# Patient Record
Sex: Female | Born: 1950 | Race: White | Hispanic: No | Marital: Married | Smoking: Never smoker
Health system: Southern US, Community
[De-identification: ages and names within clinical notes are randomized; demographics above are authoritative.]

## PROBLEM LIST (undated history)

## (undated) DIAGNOSIS — Z780 Asymptomatic menopausal state: Secondary | ICD-10-CM

## (undated) DIAGNOSIS — Z9889 Other specified postprocedural states: Secondary | ICD-10-CM

## (undated) DIAGNOSIS — G4700 Insomnia, unspecified: Secondary | ICD-10-CM

## (undated) DIAGNOSIS — F329 Major depressive disorder, single episode, unspecified: Secondary | ICD-10-CM

## (undated) DIAGNOSIS — F32A Depression, unspecified: Secondary | ICD-10-CM

## (undated) DIAGNOSIS — K219 Gastro-esophageal reflux disease without esophagitis: Secondary | ICD-10-CM

## (undated) DIAGNOSIS — E079 Disorder of thyroid, unspecified: Secondary | ICD-10-CM

## (undated) DIAGNOSIS — IMO0002 Reserved for concepts with insufficient information to code with codable children: Secondary | ICD-10-CM

## (undated) DIAGNOSIS — E785 Hyperlipidemia, unspecified: Secondary | ICD-10-CM

## (undated) DIAGNOSIS — D126 Benign neoplasm of colon, unspecified: Secondary | ICD-10-CM

## (undated) DIAGNOSIS — Z8679 Personal history of other diseases of the circulatory system: Secondary | ICD-10-CM

## (undated) HISTORY — DX: Benign neoplasm of colon, unspecified: D12.6

## (undated) HISTORY — DX: Other specified postprocedural states: Z98.890

## (undated) HISTORY — DX: Disorder of thyroid, unspecified: E07.9

## (undated) HISTORY — PX: COLONOSCOPY: SHX174

## (undated) HISTORY — DX: Asymptomatic menopausal state: Z78.0

## (undated) HISTORY — PX: OTHER SURGICAL HISTORY: SHX169

## (undated) HISTORY — DX: Major depressive disorder, single episode, unspecified: F32.9

## (undated) HISTORY — DX: Personal history of other diseases of the circulatory system: Z86.79

## (undated) HISTORY — DX: Insomnia, unspecified: G47.00

## (undated) HISTORY — DX: Depression, unspecified: F32.A

## (undated) HISTORY — DX: Reserved for concepts with insufficient information to code with codable children: IMO0002

## (undated) HISTORY — DX: Hyperlipidemia, unspecified: E78.5

## (undated) HISTORY — DX: Gastro-esophageal reflux disease without esophagitis: K21.9

## (undated) HISTORY — PX: ROTATOR CUFF REPAIR: SHX139

---

## 1998-05-16 ENCOUNTER — Other Ambulatory Visit: Admission: RE | Admit: 1998-05-16 | Discharge: 1998-05-16 | Payer: Self-pay | Admitting: Obstetrics and Gynecology

## 1998-08-09 ENCOUNTER — Ambulatory Visit (HOSPITAL_COMMUNITY): Admission: RE | Admit: 1998-08-09 | Discharge: 1998-08-09 | Payer: Self-pay

## 1999-04-01 ENCOUNTER — Inpatient Hospital Stay (HOSPITAL_COMMUNITY): Admission: EM | Admit: 1999-04-01 | Discharge: 1999-04-02 | Payer: Self-pay | Admitting: Emergency Medicine

## 1999-04-01 ENCOUNTER — Encounter: Payer: Self-pay | Admitting: Emergency Medicine

## 1999-04-02 ENCOUNTER — Encounter: Payer: Self-pay | Admitting: Emergency Medicine

## 1999-06-10 ENCOUNTER — Encounter: Payer: Self-pay | Admitting: Emergency Medicine

## 1999-06-10 ENCOUNTER — Emergency Department (HOSPITAL_COMMUNITY): Admission: EM | Admit: 1999-06-10 | Discharge: 1999-06-10 | Payer: Self-pay | Admitting: Emergency Medicine

## 1999-12-26 ENCOUNTER — Emergency Department (HOSPITAL_COMMUNITY): Admission: EM | Admit: 1999-12-26 | Discharge: 1999-12-26 | Payer: Self-pay | Admitting: Emergency Medicine

## 2000-01-09 ENCOUNTER — Ambulatory Visit (HOSPITAL_COMMUNITY): Admission: RE | Admit: 2000-01-09 | Discharge: 2000-01-10 | Payer: Self-pay | Admitting: Internal Medicine

## 2000-01-09 ENCOUNTER — Encounter: Payer: Self-pay | Admitting: Internal Medicine

## 2000-01-22 ENCOUNTER — Encounter: Payer: Self-pay | Admitting: Obstetrics and Gynecology

## 2000-01-22 ENCOUNTER — Encounter: Admission: RE | Admit: 2000-01-22 | Discharge: 2000-01-22 | Payer: Self-pay | Admitting: Obstetrics and Gynecology

## 2001-05-24 ENCOUNTER — Encounter: Payer: Self-pay | Admitting: Obstetrics and Gynecology

## 2001-05-24 ENCOUNTER — Ambulatory Visit (HOSPITAL_COMMUNITY): Admission: RE | Admit: 2001-05-24 | Discharge: 2001-05-24 | Payer: Self-pay | Admitting: Obstetrics and Gynecology

## 2002-01-17 ENCOUNTER — Encounter: Admission: RE | Admit: 2002-01-17 | Discharge: 2002-01-17 | Payer: Self-pay | Admitting: Obstetrics and Gynecology

## 2002-01-17 ENCOUNTER — Encounter: Payer: Self-pay | Admitting: Obstetrics and Gynecology

## 2002-06-22 ENCOUNTER — Emergency Department (HOSPITAL_COMMUNITY): Admission: EM | Admit: 2002-06-22 | Discharge: 2002-06-23 | Payer: Self-pay | Admitting: Emergency Medicine

## 2002-06-23 ENCOUNTER — Encounter: Payer: Self-pay | Admitting: Emergency Medicine

## 2002-06-24 ENCOUNTER — Ambulatory Visit (HOSPITAL_BASED_OUTPATIENT_CLINIC_OR_DEPARTMENT_OTHER): Admission: RE | Admit: 2002-06-24 | Discharge: 2002-06-24 | Payer: Self-pay | Admitting: Orthopedic Surgery

## 2004-06-14 ENCOUNTER — Other Ambulatory Visit: Admission: RE | Admit: 2004-06-14 | Discharge: 2004-06-14 | Payer: Self-pay | Admitting: Obstetrics and Gynecology

## 2004-08-14 ENCOUNTER — Encounter: Admission: RE | Admit: 2004-08-14 | Discharge: 2004-08-14 | Payer: Self-pay | Admitting: Obstetrics and Gynecology

## 2005-07-16 ENCOUNTER — Other Ambulatory Visit: Admission: RE | Admit: 2005-07-16 | Discharge: 2005-07-16 | Payer: Self-pay | Admitting: Obstetrics and Gynecology

## 2005-08-18 HISTORY — PX: OTHER SURGICAL HISTORY: SHX169

## 2006-06-05 ENCOUNTER — Encounter: Admission: RE | Admit: 2006-06-05 | Discharge: 2006-06-05 | Payer: Self-pay | Admitting: Obstetrics and Gynecology

## 2006-07-16 ENCOUNTER — Other Ambulatory Visit: Admission: RE | Admit: 2006-07-16 | Discharge: 2006-07-16 | Payer: Self-pay | Admitting: Obstetrics and Gynecology

## 2007-07-21 ENCOUNTER — Other Ambulatory Visit: Admission: RE | Admit: 2007-07-21 | Discharge: 2007-07-21 | Payer: Self-pay | Admitting: Obstetrics and Gynecology

## 2008-06-08 ENCOUNTER — Encounter: Admission: RE | Admit: 2008-06-08 | Discharge: 2008-06-08 | Payer: Self-pay | Admitting: Obstetrics and Gynecology

## 2008-07-25 ENCOUNTER — Other Ambulatory Visit: Admission: RE | Admit: 2008-07-25 | Discharge: 2008-07-25 | Payer: Self-pay | Admitting: Obstetrics and Gynecology

## 2009-07-26 ENCOUNTER — Other Ambulatory Visit: Admission: RE | Admit: 2009-07-26 | Discharge: 2009-07-26 | Payer: Self-pay | Admitting: Obstetrics and Gynecology

## 2010-06-06 ENCOUNTER — Encounter: Admission: RE | Admit: 2010-06-06 | Discharge: 2010-06-06 | Payer: Self-pay | Admitting: Obstetrics and Gynecology

## 2010-09-08 ENCOUNTER — Encounter: Payer: Self-pay | Admitting: Obstetrics and Gynecology

## 2010-09-12 ENCOUNTER — Other Ambulatory Visit: Payer: Self-pay | Admitting: Obstetrics and Gynecology

## 2010-09-12 ENCOUNTER — Other Ambulatory Visit
Admission: RE | Admit: 2010-09-12 | Discharge: 2010-09-12 | Payer: Self-pay | Source: Home / Self Care | Admitting: Obstetrics and Gynecology

## 2011-01-03 NOTE — Op Note (Signed)
   NAME:  MAKYNZEE, TIGGES                           ACCOUNT NO.:  [id number]   MEDICAL RECORD NO.:  [id number]                   PATIENT TYPE:  AMB   LOCATION:  DSC                                  FACILITY:  [facility]   PHYSICIAN:  Cindee Salt, M.D.                    DATE OF BIRTH:  11-29-1950   DATE OF PROCEDURE:  06/24/2002  DATE OF DISCHARGE:                                 OPERATIVE REPORT   PREOPERATIVE DIAGNOSES:  Foreign body, right hand.   POSTOPERATIVE DIAGNOSIS:  Foreign body, right hand.   OPERATION:  Removal of foreign body, right hand per first interosseus.   SURGEON:  Cindee Salt, M.D.   ANESTHESIA:  Form base IV regional.   HISTORY OF PRESENT ILLNESS:  The patient is a 60 year old female who has a  large piece of glass in her first dorsal interosseus muscle.  Neurovascularly, the hand is intact.  This occurred one evening while  attempting to sharpen a pencil and accidentally lacerating her hand.   PROCEDURE:  The patient was brought to the operating room where a form base  IV regional anesthetic was carried out without difficulty.  She was prepped  and draped using Duraprep, right arm.  The incision was opened and extended.  The foreign body was found extending into the muscle and slightly protruding  from the first dorsal interosseus.  This was a large wedge shape fragment.  This was removed in total without difficulty.  The area was irrigated.  The  laceration was in the direction of the fibers of the first dorsal  interosseus and as such, no repair was deemed necessary.  The skin was  closed with interrupted 5-0 nylon suture.  Sterile compression dressing was  applied.  She has a splint, which she will reapply.   FOLLOW UP:  She will return to the office in one week.   DISCHARGE MEDICATIONS:  1. Vicodin.  2. Keflex.                                               Cindee Salt, M.D.    GK/MEDQ  D:  06/24/2002  T:  06/25/2002  Job:  [id number]

## 2011-01-03 NOTE — Procedures (Signed)
. [facility]  Patient:    Rebecca Barber, Rebecca Barber                        MRN: [id number] Proc. Date: 01/09/00 Adm. Date:  [id number] Disc. Date: [id number] Attending:  Lewayne Bunting CC:         Barbette Hair. Vaughan Basta., M.D.; [place], [place].             Arturo Morton. Riley Kill, M.D. [facility]             Delrae Rend; [facility]                           Procedure Report  PROCEDURE:  Electrophysiologic study and RF catheter ablation of an accessory pathway with manifest W-P-W syndrome.  INTRODUCTION:  The patient is a 60 year old woman with a history of tachy palpitations beginning as a teenager.  Typically, these would occur only once a year or so, but over the last several months, she has had increasing frequency and severity of her symptoms.  She subsequently was seen and has been referred for electrophysiologic study and RF catheter ablation.  It should be noted that the patient has had multiple ER visits requiring adenosine to terminate her arrhythmias.  DESCRIPTION OF PROCEDURE:  After informed consent was obtained, the patient was taken to the diagnostic EP lab in a fasted state.  After the usual preparation and draping, intravenous fentanyl and midazolam were given for conscious sedation.  A #6 French hexapolar catheter was inserted percutaneously in the right jugular vein and advanced to the coronary sinus. A #5 French quadripolar catheter was inserted percutaneously in the right femoral vein and advanced to the RV apex.  A #5 French quadripolar catheter was inserted percutaneously in the right femoral vein and advanced to the His bundle region.  After measurement of the basic intervals, rapid ventricular pacing was carried out from the RV apex, and this demonstrated eccentric, atrial activation with the distal CS catheter being the early site of atrial activation.  Rapid ventricular pacing was carried out from the RV apex, and the pacing cycle length  decreased down to 240 ms, where the ventricular refractoriness was observed.  Prior to this, V-A conduction was maintained 1:1 and was non-detrimental.  Next, programmed ventricular stimulation was carried out from the RV apex at a at a basic drive cycle length of 578 ms.  The S1/S2 interval stepwise decreased from 400 ms down to 350 ms.  At this cycle length, the patient had nonsustained A-V reentrant tachycardia.  This was characterized by eccentric atrial activation with a V-A interval of approximately 82 ms.  The tachycardia terminated with block in the A-V node. Additional decrements in the cycle length down to 250 ms resulted in ventricular refractoriness.  Next, programmed atrial stimulation was carried out from the coronary sinus at a basic drive cycle length of 469 ms.  The S1/S2 interval was stepwise decreased down to 270 ms, where the atrial ERP was observed.  Once again during programmed atrial stimulation, the ventricle was maximally pre-excited as evidenced by pacing very close to the accessory pathway along the left lateral portion of the mitral annulus.  Accessory pathway conduction was maintained at an S1/S2 interval down to 260 ms, where atrial ERP was observed.  Next, rapid atrial pacing was carried out from the high right atrium at a pacing cycle length of 490 ms, and again,  stepwise decreased down to 250 ms, where atrial refractoriness was observed.  A-V conduction was maintained 1:1, and the ventricular activation was maximally pre-excited.  At this point, the fossa ovalis was probed and found not to be patent with the ablation catheter.  The right femoral artery was punctured, an afferent sheath was placed, the ablation catheter was maneuvered up the right femoral artery, and it was advanced retrograde across the aortic valve.  It was positioned along the mitral annulus.  Mapping of the patients accessory pathway with conduction was performed along the mitral annulus  with both ventricular and atrial pacing.  At a location approximately 3.5-4 cm from the os of the coronary sinus, there was a fractionated electrogram found.  At this location during rapid atrial pacing, the RF energy application was delivered. It should be noted that approximately 6-7 s after RF energy application, the patients manifest pre-excitation resolved.  A total of 120 s of RF energy application was delivered to this location.  Following this, ventricular pacing was carried out, and it demonstrated V-A association.  At this point, the patient was observed for approximately 40 minutes, and additional rapid atrial pacing and rapid ventricular pacing were carried out, and this demonstrated no evidence of residual accessory pathway conduction.  In addition, programmed atrial and ventricular stimulation were carried out, and this demonstrated no evidence of dual A-V nodal physiology, and there were no other inducible supraventricular arrhythmias.  At this point, the catheters were removed, hemostasis was assured, and the patient was returned to her room in good condition.  COMPLICATIONS:  None.  RESULTS: 1. Baseline ECG:  The baseline 12-lead ECG demonstrated normal sinus rhythm    with normal access in the intervals.  The P-R interval was somewhat    shortened at 150 ms, but there was no evidence of ventricular    pre-excitation. 2. Baseline intervals:  The sinus node cycle length was 952 ms.  The P-R    interval was 116 ms.  The A-H interval was 60 ms.  The H-V interval was    43 ms. The Q-R restoration was 83 ms.  The Q-T interval was 409 ms. 3. Rapid atrial pacing:  Rapid atrial pacing was carried out from the coronary    sinus down to 250 ms, which demonstrated the atrial ERP.  The A-V    conduction was 1:1 with maximal pre-excitation.  Following RF energy    application, the A-V Wenchebach cycle length was 290 ms.  The P-R interval    remained less than the R interval. 4.  Programmed atrial stimulation:  Programmed atrial stimulation was carried    out from the coronary sinus at a basic drive cycle length of 161 ms.  The     S1/S2 interval was stepwise decreased down to 350 ms, where there was    nonsustained A-V reentrant tachycardia.  Additional decrements in the S1/S2    interval down to 260 ms resulted in the ERP of the atrium.  A-V conduction    was maintained at 1:1 with maximal pre-excitation. 5. Rapid ventricular pacing:  Rapid ventricular pacing was carried out from    the RV apex and stepwise decreased down to 260 ms, where the ventricular    ERP was observed.  V-A conduction was eccentric, non-decremental, and    maintained at this pacing cycle length. 6. Programmed ventricular stimulation:  Programmed ventricular stimulation was    carried out from the RV apex at a basic drive cycle length  of 500 ms.  The    S1/S2 interval was stepwise decreased down to 250 ms where the ventricular    ERP was observed.  The V-A conduction remained non-decremental and    eccentric durin programmed ventricular stimulation. 7. Arrhythmias observed:  A-V reentrant tachycardia.  The QRS morphology was    narrow.  The cycle length was 300 ms.  The method of induction was    programmed ventricular stimulation, and the method of termination was    spontaneous as the rhythm was nonsustained.  It should be noted that    termination of the arrhythmia was present with A-V block. 8. Mapping:  Mapping of the patients accessory pathway demonstrated it to be    approximately 3.5-4 cm from the os of the coronary sinus. 9. RF energy application:  A single, 120 s, RF energy application was    delivered to the ventricular insertion of the mitral annulus, approximately    4 cm from the os of the coronary sinus.  This resulted in termination of    the patients pre-excitation and the creation of V-A association during    ventricular pacing.  Following this, the patient was observed for  40    minutes and had no evidence of residual accessory pathway conduction.  CONCLUSION:  This demonstrates evidence of what was initially concealed, but then with atrial pacing, manifest accessory pathway conduction with inducible nonsustained A-V reentrant tachycardia.  The accessory pathway was successfully ablated with a single RF energy application delivered approximately 4 cm from the os of the coronary sinus along the ventricular insertion of the accessory pathway.  There were no immediate procedural complications. DD:  01/09/00 TD:  01/13/00 Job: [id number]

## 2011-01-03 NOTE — Discharge Summary (Signed)
[place]  Patient:    Rebecca Barber, Rebecca Barber                        MRN: [id number] Adm. Date:  [id number] Disc. Date: [id number] Attending:  Lewayne Bunting Dictator:   Abelino Derrick, P.A.C. [facility]                           Discharge Summary  DISCHARGE DIAGNOSIS:  Recurrent supraventricular tachycardia status post                       radiofrequency ablation this admission.  HOSPITAL COURSE:  Patient is a 60 year old female with a history of tachy palpitations and SVT who was admitted for EP study and radiofrequency ablation on 01/09/00 by Dr. Ladona Ridgel.  She has a history of documented SVT which has been increasing in severity.  She was electively admitted and underwent EP study 01/09/00 by Dr. Ladona Ridgel, please see op note for complete details.  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  She underwent successful radiofrequency ablation and is discharged on 01/10/00.  DISCHARGE MEDICATIONS: 1. Coated aspirin q.d. for four weeks. 2. Loestrin q.d. 3. Synthroid 0.1 mg a day. 4. Trazodone 15 mg h.s.  LABORATORY DATA:  White count 5.1, hemoglobin 14.6, hematocrit 41.6, platelet count 272.  INR 0.9.  Sodium 130, potassium 4.2, BUN 17, creatinine 1.2. Serum pregnancy test was negative.  EKG on 01/10/00 shows normal sinus rhythm with no acute changes.  DISPOSITION:  Patient is discharged in stable condition and will follow up with Dr. Ladona Ridgel in six to eight weeks.  She has been instructed not to do any heavy lifting or driving until Monday. DD:  01/10/00 TD:  01/10/00 Job: [id number]

## 2011-01-16 ENCOUNTER — Encounter: Payer: Self-pay | Admitting: *Deleted

## 2011-01-23 ENCOUNTER — Ambulatory Visit (INDEPENDENT_AMBULATORY_CARE_PROVIDER_SITE_OTHER): Payer: 59 | Admitting: Family Medicine

## 2011-01-23 ENCOUNTER — Encounter: Payer: Self-pay | Admitting: Family Medicine

## 2011-01-23 VITALS — BP 110/68 | HR 76 | Ht 66.0 in | Wt 146.0 lb

## 2011-01-23 DIAGNOSIS — Z0000 Encounter for general adult medical examination without abnormal findings: Secondary | ICD-10-CM

## 2011-01-23 DIAGNOSIS — G4700 Insomnia, unspecified: Secondary | ICD-10-CM

## 2011-01-23 DIAGNOSIS — E039 Hypothyroidism, unspecified: Secondary | ICD-10-CM

## 2011-01-23 DIAGNOSIS — R5383 Other fatigue: Secondary | ICD-10-CM

## 2011-01-23 DIAGNOSIS — E7800 Pure hypercholesterolemia, unspecified: Secondary | ICD-10-CM

## 2011-01-23 LAB — CBC WITH DIFFERENTIAL/PLATELET
Basophils Absolute: 0 10*3/uL (ref 0.0–0.1)
Eosinophils Relative: 1 % (ref 0–5)
HCT: 40.8 % (ref 36.0–46.0)
Hemoglobin: 13.7 g/dL (ref 12.0–15.0)
Lymphocytes Relative: 30 % (ref 12–46)
Lymphs Abs: 1.5 10*3/uL (ref 0.7–4.0)
MCV: 92.3 fL (ref 78.0–100.0)
Monocytes Absolute: 0.4 10*3/uL (ref 0.1–1.0)
Monocytes Relative: 7 % (ref 3–12)
RDW: 13.1 % (ref 11.5–15.5)
WBC: 5 10*3/uL (ref 4.0–10.5)

## 2011-01-23 LAB — LIPID PANEL
Cholesterol: 239 mg/dL — ABNORMAL HIGH (ref 0–200)
Total CHOL/HDL Ratio: 3.5 Ratio
Triglycerides: 60 mg/dL (ref <150)
VLDL: 12 mg/dL (ref 0–40)

## 2011-01-23 LAB — T4, FREE: Free T4: 1.13 ng/dL (ref 0.80–1.80)

## 2011-01-23 LAB — COMPREHENSIVE METABOLIC PANEL
AST: 25 U/L (ref 0–37)
Alkaline Phosphatase: 71 U/L (ref 39–117)
BUN: 17 mg/dL (ref 6–23)
Creat: 1.09 mg/dL (ref 0.50–1.10)

## 2011-01-23 MED ORDER — SYNTHROID 88 MCG PO TABS: 88.0000 ug | ORAL_TABLET | Freq: Every day | ORAL | Status: DC

## 2011-01-23 MED ORDER — TRAZODONE HCL 150 MG PO TABS: 150.0000 mg | ORAL_TABLET | Freq: Every day | ORAL | Status: DC

## 2011-01-23 NOTE — Progress Notes (Signed)
Subjective:    Patient ID: Rebecca Barber, female    DOB: [birth year], 60 y.o.   MRN: [id number]  HPI  Patient presents to transfer care from [facility].  She is requesting medication refills for her thyroid and sleeping medications. She has no specific concerns.  Hyperlipidemia follow-up:  Patient is reportedly following a low-fat, low cholesterol diet. Last check was in 03/2009, LDL was 147, HDL 65, TG 68, total 227.  Has never had hs-CRP checked.  Hypothyroid follow-up.  Last TSH was 05/2010 and was normal.  She denies having any thyroid-related symptoms.  She is requesting to have free T3 (and T4) checked along with her TSH.  She is fasting today; hasn't had fasting sugar checked in many years  Past Medical History  Diagnosis Date  . Hyperlipidemia   . Thyroid disease hypothyroidism  . Postmenopausal   . Depression h/o in the 90's due to hypothyroid;also h/o pain as a result of thyroid not beingcontrolled for many years, per pt  . Insomnia   . Cyst in "bone marrow" in wrist  . S/P catheter ablation of slow pathway EP stud w/RF catheter ablation of accessory pathway with WPW-DrTaylor 5/01    Past Surgical History  Procedure Date  . Ganglion cyst left foot ,age 63  . Rotator cuff repair RIGHT,Dr.Norris  . Injury of left meniscus DrOlin 02/2008    History   Social History  . Marital Status: Married    Spouse Name: N/A    Number of Children: 4  . Years of Education: N/A   Occupational History  . 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 Smoking status: Never Smoker   . Smokeless tobacco: Never Used  . Alcohol Use: Yes     2-3 glasses of wine weekly  . Drug Use: No  . Sexually Active: Not on file   Other Topics Concern  . Not on file   Social History Narrative  . No narrative on file    Family History  Problem Relation Age of Onset  . Stroke Mother   . Transient ischemic attack Mother   . Stroke Father   . Aneurysm Father     brain  . Heart attack  Brother   . Schizophrenia Brother   . Eating disorder Daughter   . Cancer Brother     parathyroid and agent orange  . Depression Brother   . Cancer Cousin     breast CA in her 59's  . Fibromyalgia Daughter     Current outpatient prescriptions:SYNTHROID 88 MCG tablet, Take 1 tablet (88 mcg total) by mouth daily., Disp: 90 tablet, Rfl: 3;  traZODone (DESYREL) 150 MG tablet, Take 1 tablet (150 mg total) by mouth at bedtime., Disp: 90 tablet, Rfl: 3;  DISCONTD: levothyroxine (SYNTHROID) 88 MCG tablet, Take 88 mcg by mouth daily.  , Disp: , Rfl: ;  DISCONTD: traZODone (DESYREL) 150 MG tablet, Take 150 mg by mouth at bedtime.  , Disp: , Rfl:  DISCONTD: levothyroxine (SYNTHROID, LEVOTHROID) 88 MCG tablet, Take 88 mcg by mouth daily.  , Disp: , Rfl:   Allergies  Allergen Reactions  . Celebrex (Celecoxib) Itching, Swelling and Other (See Comments)    Facial blisters   Review of Systems Deals with some chronic fatigue, and trouble with metabolism (if doesn't watch her diet closely, can easily gain weight quickly).  Denies bowel changes, skin changes or hair loss.  Moods are good. Insomnia controlled with 1/2-1 trazadone  Objective:   Physical Exam  Well developed, well nourished patient, in no distress BP 110/68  Pulse 76  Ht 5\' 6"  (1.676 m)  Wt 146 lb (66.225 kg)  BMI 23.56 kg/m2 Neck: No lymphadenopathy or thyromegaly, no carotid bruit Heart:  Regular rate and rhythm, no murmurs, rubs, gallops or ectopy Lungs:  Clear bilaterally, without wheezes, rales or ronchi Abdomen:  Soft, nontender, nondistended, no hepatosplenomegaly or masses, normal bowel sounds Extremities:  No clubbing, cyanosis or edema, 2+ pulses.  Neuro:  Alert and oriented x 3, cranial nerves grossly intact.  DTR's 2+ and symmetric.  Normal strength and sensation Back:  No spine or CVA tenderness Skin: no rashes or suspicious lesions. Sebaceous cyst above L lateral clavicle Psych:  Normal mood, affect, hygiene and  grooming, normal speech, eye contact      Assessment & Plan:   1. Unspecified hypothyroidism  TSH, T3, free, T4, free, SYNTHROID 88 MCG tablet  2. Pure hypercholesterolemia  Lipid panel, CRP High sensitivity  3. Routine general medical examination at a health care facility    4. Fatigue  Comprehensive metabolic panel, Vitamin D 25 hydroxy, CBC with Differential  5. Insomnia  traZODone (DESYREL) 150 MG tablet    Tdap likely due in 2013 (had ER visit approx 5 years ago, but doesn't recall if Td given, recalls getting Td in 2003) Declines flu shots Zostavax discussed, including risks/benefits.  She will check insurance coverage, and can call to schedule NV if desired. Colonoscopy 2003, due again 2013

## 2011-01-27 ENCOUNTER — Encounter: Payer: Self-pay | Admitting: Family Medicine

## 2011-11-11 ENCOUNTER — Telehealth: Payer: Self-pay | Admitting: Family Medicine

## 2011-11-11 NOTE — Telephone Encounter (Signed)
DONE

## 2012-02-27 ENCOUNTER — Encounter: Payer: Self-pay | Admitting: Internal Medicine

## 2012-12-02 ENCOUNTER — Encounter: Payer: Self-pay | Admitting: Internal Medicine

## 2013-06-16 ENCOUNTER — Other Ambulatory Visit: Payer: Self-pay

## 2013-06-16 DIAGNOSIS — Z1231 Encounter for screening mammogram for malignant neoplasm of breast: Secondary | ICD-10-CM

## 2013-10-26 ENCOUNTER — Encounter: Payer: Self-pay | Admitting: Family Medicine

## 2014-05-12 ENCOUNTER — Encounter: Payer: Self-pay | Admitting: Internal Medicine

## 2014-06-05 ENCOUNTER — Ambulatory Visit
Admission: RE | Admit: 2014-06-05 | Discharge: 2014-06-05 | Disposition: A | Discharge status: Home / Self Care | Payer: 59 | Source: Ambulatory Visit

## 2014-06-05 DIAGNOSIS — Z1231 Encounter for screening mammogram for malignant neoplasm of breast: Secondary | ICD-10-CM

## 2014-06-19 ENCOUNTER — Encounter: Payer: Self-pay | Admitting: Family Medicine

## 2014-07-03 ENCOUNTER — Ambulatory Visit (AMBULATORY_SURGERY_CENTER): Payer: Self-pay | Admitting: *Deleted

## 2014-07-03 VITALS — Ht 66.0 in | Wt 159.4 lb

## 2014-07-03 DIAGNOSIS — Z1211 Encounter for screening for malignant neoplasm of colon: Secondary | ICD-10-CM

## 2014-07-03 MED ORDER — MOVIPREP 100 G PO SOLR: 1.0000 | Freq: Once | ORAL | Status: DC

## 2014-07-03 NOTE — Progress Notes (Signed)
No egg or soy allergy. ewm No diet pills. ewm No home 02 use. ewm No problems with past sedation. ewm

## 2014-07-07 ENCOUNTER — Encounter: Payer: Self-pay | Admitting: Internal Medicine

## 2014-07-18 DIAGNOSIS — D126 Benign neoplasm of colon, unspecified: Secondary | ICD-10-CM

## 2014-07-18 HISTORY — DX: Benign neoplasm of colon, unspecified: D12.6

## 2014-07-19 ENCOUNTER — Encounter: Payer: Self-pay | Admitting: Internal Medicine

## 2014-07-19 ENCOUNTER — Ambulatory Visit (AMBULATORY_SURGERY_CENTER): Payer: 59 | Admitting: Internal Medicine

## 2014-07-19 VITALS — BP 139/70 | HR 76 | Temp 97.0°F | Resp 16 | Ht 66.0 in | Wt 159.0 lb

## 2014-07-19 DIAGNOSIS — D122 Benign neoplasm of ascending colon: Secondary | ICD-10-CM

## 2014-07-19 DIAGNOSIS — Z1211 Encounter for screening for malignant neoplasm of colon: Secondary | ICD-10-CM

## 2014-07-19 MED ORDER — SODIUM CHLORIDE 0.9 % IV SOLN: 500.0000 mL | INTRAVENOUS | Status: DC

## 2014-07-19 NOTE — Progress Notes (Signed)
1115PATIENT STATING SHE CANNOT BREATHE WELL, CROUPY COUGH. ENCOURAGED SLOW EASY BREATHS, INCREASED 02 TO 6L/MIN. PATIENT CALMING DOWN, SA02 REMAINS NORMAL. SKIN WARM DRY AND PINK.DECREASED 02 TO 4L/MN AT 1120. JOHN NULTY CRNA IN TO CHECK PATIENT. RESPIRATIONS NONLABORED, SKIN WARM DRY AND PINK, VITAL SIGNS STABLE. 02 DECREASED TO 2L/MIN. 1125 HUSBAND AT BEDSIDE, PATIENT STARTED WITH CROWING RESPIRATIONS, VERY ANXIOUS AND COMPLAINED  OF INABILITY TO BREATH. DR. Olevia Perches AT BEDSIDE. L[place]NN [place]. ALBUTEROL NEBULIZER ORDERED. NEBULIZER TREATMENT GIVEN AT 1130. RESPIRATIONS CALMED. PATIENT PASSING FLATUS IN LARGE AMOUNTS. PATIENT CALM, NO FURTHER COUGHING AT 1155.

## 2014-07-19 NOTE — Patient Instructions (Signed)
PLEASE CALL DR. Olevia Perches WITH TEMPERATURE GREATER THAN 100. CALL WITH ANY RESPIRATORY SYMPTOMS, INCREASED COUGH OR SPUTUM PRODUCTION.   YOU HAD AN ENDOSCOPIC PROCEDURE TODAY AT [facility]: Refer to the procedure report that was given to you for any specific questions about what was found during the examination.  If the procedure report does not answer your questions, please call your gastroenterologist to clarify.  If you requested that your care partner not be given the details of your procedure findings, then the procedure report has been included in a sealed envelope for you to review at your convenience later.  YOU SHOULD EXPECT: Some feelings of bloating in the abdomen. Passage of more gas than usual.  Walking can help get rid of the air that was put into your GI tract during the procedure and reduce the bloating. If you had a lower endoscopy (such as a colonoscopy or flexible sigmoidoscopy) you may notice spotting of blood in your stool or on the toilet paper. If you underwent a bowel prep for your procedure, then you may not have a normal bowel movement for a few days.  DIET: Your first meal following the procedure should be a light meal and then it is ok to progress to your normal diet.  A half-sandwich or bowl of soup is an example of a good first meal.  Heavy or fried foods are harder to digest and may make you feel nauseous or bloated.  Likewise meals heavy in dairy and vegetables can cause extra gas to form and this can also increase the bloating.  Drink plenty of fluids but you should avoid alcoholic beverages for 24 hours.  ACTIVITY: Your care partner should take you home directly after the procedure.  You should plan to take it easy, moving slowly for the rest of the day.  You can resume normal activity the day after the procedure however you should NOT DRIVE or use heavy machinery for 24 hours (because of the sedation medicines used during the test).    SYMPTOMS TO REPORT  IMMEDIATELY: A gastroenterologist can be reached at any hour.  During normal business hours, 8:30 AM to 5:00 PM Monday through Friday, call [phone].  After hours and on weekends, please call the GI answering service at [phone] who will take a message and have the physician on call contact you.   Following lower endoscopy (colonoscopy or flexible sigmoidoscopy):  Excessive amounts of blood in the stool  Significant tenderness or worsening of abdominal pains  Swelling of the abdomen that is new, acute  Fever of 100F or higher  FOLLOW UP: If any biopsies were taken you will be contacted by phone or by letter within the next 1-3 weeks.  Call your gastroenterologist if you have not heard about the biopsies in 3 weeks.  Our staff will call the home number listed on your records the next business day following your procedure to check on you and address any questions or concerns that you may have at that time regarding the information given to you following your procedure. This is a courtesy call and so if there is no answer at the home number and we have not heard from you through the emergency physician on call, we will assume that you have returned to your regular daily activities without incident.  SIGNATURES/CONFIDENTIALITY: You and/or your care partner have signed paperwork which will be entered into your electronic medical record.  These signatures attest to the fact that that the  information above on your After Visit Summary has been reviewed and is understood.  Full responsibility of the confidentiality of this discharge information lies with you and/or your care-partner.

## 2014-07-19 NOTE — Progress Notes (Signed)
1041- abdominal pressure applied by tech- immediate clear fluid in oral mouth- suctioned quickly increased 02, allowed to wake - HOB elevated. Suctioned frequently.

## 2014-07-19 NOTE — Progress Notes (Signed)
ON ARRIVAL TO RECOVERY PATIENT COUGHING, NONPRODUCTIVELY. NO RESPIRATORY DISTRESS, SKIN WARM DRY AND PINK. ABDOMEN SOFT, NO FLATUS PASSSED. 02 AT 2L/MIN. DR. Olevia Perches AT BEDSIDE. CHEST ON AUSCULTATION, SOME RHONCHI BILATERALLY, CLEARS PAST COUGHING. TEMPERATURE 96.4.

## 2014-07-19 NOTE — Op Note (Signed)
[place]  [address] [place], [id number]   COLONOSCOPY PROCEDURE REPORT  PATIENT: Rebecca, Barber  MR#: [id number] BIRTHDATE: 05/30/1951 , 19  yrs. old GENDER: female ENDOSCOPIST: Lafayette Dragon, MD REFERRED Terrall Laity, M.D. , Dr Carrolyn Meiers PROCEDURE DATE:  07/19/2014 PROCEDURE:   Colonoscopy with snare polypectomy First Screening Colonoscopy - Avg.  risk and is 50 yrs.  old or older - No.  Prior Negative Screening - Now for repeat screening. 10 or more years since last screening  History of Adenoma - Now for follow-up colonoscopy & has been > or = to 3 yrs.  N/A  Polyps Removed Today? Yes. ASA CLASS:   Class II INDICATIONS:average risk for colon cancer and prior colonoscopy in September 2003 was a normal exam. MEDICATIONS: Monitored anesthesia care and Propofol 240 mg IV  DESCRIPTION OF PROCEDURE:   After the risks benefits and alternatives of the procedure were thoroughly explained, informed consent was obtained.  The digital rectal exam revealed no abnormalities of the rectum.   The LB 1528  endoscope was introduced through the anus and advanced to the cecum, which was identified by both the appendix and ileocecal valve. No adverse events experienced.   The quality of the prep was good, using MoviPrep  The instrument was then slowly withdrawn as the colon was fully examined.      COLON FINDINGS: A sessile polyp measuring 8 mm in size was found in the ascending colon.  A polypectomy was performed with a cold snare.  The resection was complete, the polyp tissue was completely retrieved and sent to histology.  Retroflexed views revealed no abnormalities. The time to cecum=6 minutes 00 seconds.  Withdrawal time=11 minutes 00 seconds.  The scope was withdrawn and the procedure completed. COMPLICATIONS: There were no immediate complications.  ENDOSCOPIC IMPRESSION: Sessile polyp was found in the ascending colon; polypectomy was performed with a cold  snare  RECOMMENDATIONS: 1.  Await pathology results 2.  High fiber diet Recall colonoscopy pending path report  eSigned:  Lafayette Dragon, MD 07/19/2014 10:54 AM   cc:

## 2014-07-19 NOTE — Progress Notes (Signed)
PATIENT GREATLY IMPROVED. PATIENT PASSING GAS AND NO LONGER COUGHING. SKIN WARM DRY AND PINK.DR. Olevia Perches IN TO DISCHARGE PATIENT. PATIENT CLEARED FOR DISCHARGE. PATIENT AND CARE PARTNER VERBALIZED AGREEMENT WITH DISCHARGE.

## 2014-07-19 NOTE — Progress Notes (Signed)
Called to room to assist during endoscopic procedure.  Patient ID and intended procedure confirmed with present staff. Received instructions for my participation in the procedure from the performing physician.  

## 2014-07-19 NOTE — Progress Notes (Signed)
In to start IV.  Pt very anxious and confrontational.  Explained to pt I was getting ready to stick.  Pt stiffened up, jumped and pulled arm back.  Started IV fluids and vein at insertion site started to swell.  Stopped IV fluids and removed catheter.  Pt very ugly. Explained to her when she jumped the needle went through the vein.  Pt still continues to be verbally ugly.  Placed pressure dressing to site and called Edmon Crape Rn in to start IV.

## 2014-07-20 ENCOUNTER — Telehealth: Payer: Self-pay | Admitting: *Deleted

## 2014-07-20 NOTE — Telephone Encounter (Signed)
  Follow up Call-  Call back number 07/19/2014  Post procedure Call Back phone  # [phone]  Permission to leave phone message Yes     Patient questions:  Do you have a fever, pain , or abdominal swelling? No. Pain Score  0 *  Have you tolerated food without any problems? Yes.    Have you been able to return to your normal activities? Yes.    Do you have any questions about your discharge instructions: Diet   No. Medications  No. Follow up visit  No.  Do you have questions or concerns about your Care? Yes.    Actions: * If pain score is 4 or above: No action needed, pain <4.  Pt. States that she has a "raw throat" as a result of suctioning yesterday.  Advised to drink warm liquids today. She had headace as well.  Took Advil last night with relief.  Advised to call if difficulty breathing or fever arise.

## 2014-07-24 ENCOUNTER — Encounter: Payer: Self-pay | Admitting: Internal Medicine

## 2014-07-27 ENCOUNTER — Encounter: Payer: Self-pay | Admitting: Family Medicine

## 2014-10-18 DIAGNOSIS — M25511 Pain in right shoulder: Secondary | ICD-10-CM | POA: Diagnosis not present

## 2014-10-20 DIAGNOSIS — Z01419 Encounter for gynecological examination (general) (routine) without abnormal findings: Secondary | ICD-10-CM | POA: Diagnosis not present

## 2014-12-07 DIAGNOSIS — L570 Actinic keratosis: Secondary | ICD-10-CM | POA: Diagnosis not present

## 2015-01-03 DIAGNOSIS — M25571 Pain in right ankle and joints of right foot: Secondary | ICD-10-CM | POA: Diagnosis not present

## 2015-01-23 DIAGNOSIS — L570 Actinic keratosis: Secondary | ICD-10-CM | POA: Diagnosis not present

## 2015-01-24 DIAGNOSIS — M25571 Pain in right ankle and joints of right foot: Secondary | ICD-10-CM | POA: Diagnosis not present

## 2015-03-21 DIAGNOSIS — L570 Actinic keratosis: Secondary | ICD-10-CM | POA: Diagnosis not present

## 2015-06-04 ENCOUNTER — Other Ambulatory Visit: Payer: Self-pay

## 2015-06-04 DIAGNOSIS — Z1231 Encounter for screening mammogram for malignant neoplasm of breast: Secondary | ICD-10-CM

## 2015-06-27 ENCOUNTER — Ambulatory Visit: Payer: Self-pay

## 2015-07-05 ENCOUNTER — Ambulatory Visit
Admission: RE | Admit: 2015-07-05 | Discharge: 2015-07-05 | Disposition: A | Discharge status: Home / Self Care | Payer: Medicare Other | Source: Ambulatory Visit

## 2015-07-05 DIAGNOSIS — Z1231 Encounter for screening mammogram for malignant neoplasm of breast: Secondary | ICD-10-CM | POA: Diagnosis not present

## 2015-10-10 DIAGNOSIS — H00014 Hordeolum externum left upper eyelid: Secondary | ICD-10-CM | POA: Diagnosis not present

## 2015-10-10 DIAGNOSIS — L570 Actinic keratosis: Secondary | ICD-10-CM | POA: Diagnosis not present

## 2015-10-10 DIAGNOSIS — D225 Melanocytic nevi of trunk: Secondary | ICD-10-CM | POA: Diagnosis not present

## 2015-10-10 DIAGNOSIS — Z872 Personal history of diseases of the skin and subcutaneous tissue: Secondary | ICD-10-CM | POA: Diagnosis not present

## 2015-10-10 DIAGNOSIS — Z23 Encounter for immunization: Secondary | ICD-10-CM | POA: Diagnosis not present

## 2015-10-10 DIAGNOSIS — B070 Plantar wart: Secondary | ICD-10-CM | POA: Diagnosis not present

## 2015-11-10 DIAGNOSIS — R05 Cough: Secondary | ICD-10-CM | POA: Diagnosis not present

## 2015-11-10 DIAGNOSIS — R6889 Other general symptoms and signs: Secondary | ICD-10-CM | POA: Diagnosis not present

## 2015-11-10 DIAGNOSIS — J209 Acute bronchitis, unspecified: Secondary | ICD-10-CM | POA: Diagnosis not present

## 2016-05-06 DIAGNOSIS — M722 Plantar fascial fibromatosis: Secondary | ICD-10-CM | POA: Diagnosis not present

## 2016-05-06 DIAGNOSIS — M216X1 Other acquired deformities of right foot: Secondary | ICD-10-CM | POA: Diagnosis not present

## 2016-05-06 DIAGNOSIS — M79671 Pain in right foot: Secondary | ICD-10-CM | POA: Diagnosis not present

## 2016-05-06 DIAGNOSIS — M216X2 Other acquired deformities of left foot: Secondary | ICD-10-CM | POA: Diagnosis not present

## 2016-05-20 ENCOUNTER — Other Ambulatory Visit: Payer: Self-pay | Admitting: Obstetrics and Gynecology

## 2016-05-20 DIAGNOSIS — Z1231 Encounter for screening mammogram for malignant neoplasm of breast: Secondary | ICD-10-CM

## 2016-06-04 DIAGNOSIS — M79671 Pain in right foot: Secondary | ICD-10-CM | POA: Diagnosis not present

## 2016-06-04 DIAGNOSIS — M25571 Pain in right ankle and joints of right foot: Secondary | ICD-10-CM | POA: Diagnosis not present

## 2016-06-04 DIAGNOSIS — M722 Plantar fascial fibromatosis: Secondary | ICD-10-CM | POA: Diagnosis not present

## 2016-06-04 DIAGNOSIS — M216X1 Other acquired deformities of right foot: Secondary | ICD-10-CM | POA: Diagnosis not present

## 2016-06-16 DIAGNOSIS — M722 Plantar fascial fibromatosis: Secondary | ICD-10-CM | POA: Diagnosis not present

## 2016-06-26 DIAGNOSIS — M722 Plantar fascial fibromatosis: Secondary | ICD-10-CM | POA: Diagnosis not present

## 2016-06-26 DIAGNOSIS — Z01419 Encounter for gynecological examination (general) (routine) without abnormal findings: Secondary | ICD-10-CM | POA: Diagnosis not present

## 2016-06-26 DIAGNOSIS — N950 Postmenopausal bleeding: Secondary | ICD-10-CM | POA: Diagnosis not present

## 2016-06-26 DIAGNOSIS — Z124 Encounter for screening for malignant neoplasm of cervix: Secondary | ICD-10-CM | POA: Diagnosis not present

## 2016-06-30 DIAGNOSIS — M722 Plantar fascial fibromatosis: Secondary | ICD-10-CM | POA: Diagnosis not present

## 2016-07-15 ENCOUNTER — Ambulatory Visit: Payer: Medicare Other

## 2016-07-24 DIAGNOSIS — N950 Postmenopausal bleeding: Secondary | ICD-10-CM | POA: Diagnosis not present

## 2016-07-30 DIAGNOSIS — S93692A Other sprain of left foot, initial encounter: Secondary | ICD-10-CM | POA: Diagnosis not present

## 2016-07-30 DIAGNOSIS — M25562 Pain in left knee: Secondary | ICD-10-CM | POA: Diagnosis not present

## 2016-07-30 DIAGNOSIS — M25572 Pain in left ankle and joints of left foot: Secondary | ICD-10-CM | POA: Diagnosis not present

## 2016-08-22 DIAGNOSIS — M722 Plantar fascial fibromatosis: Secondary | ICD-10-CM | POA: Diagnosis not present

## 2016-08-22 DIAGNOSIS — M25562 Pain in left knee: Secondary | ICD-10-CM | POA: Diagnosis not present

## 2016-09-25 DIAGNOSIS — H524 Presbyopia: Secondary | ICD-10-CM | POA: Diagnosis not present

## 2016-09-25 DIAGNOSIS — H5213 Myopia, bilateral: Secondary | ICD-10-CM | POA: Diagnosis not present

## 2016-09-25 DIAGNOSIS — H2513 Age-related nuclear cataract, bilateral: Secondary | ICD-10-CM | POA: Diagnosis not present

## 2016-09-25 DIAGNOSIS — H0014 Chalazion left upper eyelid: Secondary | ICD-10-CM | POA: Diagnosis not present

## 2016-10-14 DIAGNOSIS — D1801 Hemangioma of skin and subcutaneous tissue: Secondary | ICD-10-CM | POA: Diagnosis not present

## 2016-10-14 DIAGNOSIS — Z23 Encounter for immunization: Secondary | ICD-10-CM | POA: Diagnosis not present

## 2016-10-14 DIAGNOSIS — L814 Other melanin hyperpigmentation: Secondary | ICD-10-CM | POA: Diagnosis not present

## 2016-10-14 DIAGNOSIS — L821 Other seborrheic keratosis: Secondary | ICD-10-CM | POA: Diagnosis not present

## 2016-10-14 DIAGNOSIS — L570 Actinic keratosis: Secondary | ICD-10-CM | POA: Diagnosis not present

## 2016-10-14 DIAGNOSIS — D225 Melanocytic nevi of trunk: Secondary | ICD-10-CM | POA: Diagnosis not present

## 2016-12-04 DIAGNOSIS — S63641A Sprain of metacarpophalangeal joint of right thumb, initial encounter: Secondary | ICD-10-CM | POA: Diagnosis not present

## 2016-12-30 DIAGNOSIS — S63641D Sprain of metacarpophalangeal joint of right thumb, subsequent encounter: Secondary | ICD-10-CM | POA: Diagnosis not present

## 2017-03-20 DIAGNOSIS — N390 Urinary tract infection, site not specified: Secondary | ICD-10-CM | POA: Diagnosis not present

## 2017-03-20 DIAGNOSIS — R319 Hematuria, unspecified: Secondary | ICD-10-CM | POA: Diagnosis not present

## 2017-03-20 DIAGNOSIS — R300 Dysuria: Secondary | ICD-10-CM | POA: Diagnosis not present

## 2017-04-17 ENCOUNTER — Other Ambulatory Visit: Payer: Self-pay | Admitting: Orthopedic Surgery

## 2017-04-17 DIAGNOSIS — S63641D Sprain of metacarpophalangeal joint of right thumb, subsequent encounter: Secondary | ICD-10-CM

## 2017-04-28 ENCOUNTER — Ambulatory Visit
Admission: RE | Admit: 2017-04-28 | Discharge: 2017-04-28 | Disposition: A | Discharge status: Home / Self Care | Payer: Medicare Other | Source: Ambulatory Visit | Attending: Orthopedic Surgery | Admitting: Orthopedic Surgery

## 2017-04-28 DIAGNOSIS — H109 Unspecified conjunctivitis: Secondary | ICD-10-CM | POA: Diagnosis not present

## 2017-04-28 DIAGNOSIS — S63641D Sprain of metacarpophalangeal joint of right thumb, subsequent encounter: Secondary | ICD-10-CM

## 2017-04-28 DIAGNOSIS — M189 Osteoarthritis of first carpometacarpal joint, unspecified: Secondary | ICD-10-CM | POA: Diagnosis not present

## 2017-04-29 DIAGNOSIS — R350 Frequency of micturition: Secondary | ICD-10-CM | POA: Diagnosis not present

## 2017-04-29 DIAGNOSIS — R319 Hematuria, unspecified: Secondary | ICD-10-CM | POA: Diagnosis not present

## 2017-04-29 DIAGNOSIS — N390 Urinary tract infection, site not specified: Secondary | ICD-10-CM | POA: Diagnosis not present

## 2017-05-07 DIAGNOSIS — S63641D Sprain of metacarpophalangeal joint of right thumb, subsequent encounter: Secondary | ICD-10-CM | POA: Diagnosis not present

## 2017-07-08 DIAGNOSIS — Z23 Encounter for immunization: Secondary | ICD-10-CM | POA: Diagnosis not present

## 2017-08-28 ENCOUNTER — Encounter (HOSPITAL_COMMUNITY): Payer: Self-pay | Admitting: Emergency Medicine

## 2017-08-28 ENCOUNTER — Emergency Department (HOSPITAL_COMMUNITY): Payer: Medicare Other

## 2017-08-28 ENCOUNTER — Other Ambulatory Visit: Payer: Self-pay

## 2017-08-28 ENCOUNTER — Emergency Department (HOSPITAL_COMMUNITY)
Admission: EM | Admit: 2017-08-28 | Discharge: 2017-08-28 | Disposition: A | Discharge status: Home / Self Care | Payer: Medicare Other | Attending: Emergency Medicine | Admitting: Emergency Medicine

## 2017-08-28 DIAGNOSIS — J209 Acute bronchitis, unspecified: Secondary | ICD-10-CM

## 2017-08-28 DIAGNOSIS — E039 Hypothyroidism, unspecified: Secondary | ICD-10-CM | POA: Diagnosis not present

## 2017-08-28 DIAGNOSIS — Z79899 Other long term (current) drug therapy: Secondary | ICD-10-CM | POA: Insufficient documentation

## 2017-08-28 DIAGNOSIS — R05 Cough: Secondary | ICD-10-CM | POA: Diagnosis not present

## 2017-08-28 DIAGNOSIS — R0602 Shortness of breath: Secondary | ICD-10-CM | POA: Diagnosis not present

## 2017-08-28 LAB — BASIC METABOLIC PANEL
Anion gap: 9 (ref 5–15)
BUN: 13 mg/dL (ref 6–20)
CALCIUM: 9.1 mg/dL (ref 8.9–10.3)
CO2: 24 mmol/L (ref 22–32)
CREATININE: 0.88 mg/dL (ref 0.44–1.00)
Chloride: 105 mmol/L (ref 101–111)
GFR calc Af Amer: >60 mL/min (ref >60)
GFR calc non Af Amer: >60 mL/min (ref >60)
GLUCOSE: 128 mg/dL — AB (ref 65–99)
Potassium: 4.2 mmol/L (ref 3.5–5.1)
Sodium: 138 mmol/L (ref 135–145)

## 2017-08-28 LAB — CBC
HCT: 43 % (ref 36.0–46.0)
Hemoglobin: 14 g/dL (ref 12.0–15.0)
MCH: 29.8 pg (ref 26.0–34.0)
MCHC: 32.6 g/dL (ref 30.0–36.0)
MCV: 91.5 fL (ref 78.0–100.0)
PLATELETS: 205 10*3/uL (ref 150–400)
RBC: 4.7 MIL/uL (ref 3.87–5.11)
RDW: 12.9 % (ref 11.5–15.5)
WBC: 11.1 10*3/uL — ABNORMAL HIGH (ref 4.0–10.5)

## 2017-08-28 LAB — I-STAT TROPONIN, ED: TROPONIN I, POC: 0 ng/mL (ref 0.00–0.08)

## 2017-08-28 MED ORDER — ALBUTEROL SULFATE HFA 108 (90 BASE) MCG/ACT IN AERS: 2.0000 | INHALATION_SPRAY | RESPIRATORY_TRACT | 3 refills | Status: DC | PRN

## 2017-08-28 MED ORDER — PREDNISONE 20 MG PO TABS: 40.0000 mg | ORAL_TABLET | Freq: Every day | ORAL | 0 refills | Status: DC

## 2017-08-28 MED ORDER — AZITHROMYCIN 250 MG PO TABS: 250.0000 mg | ORAL_TABLET | Freq: Every day | ORAL | 0 refills | Status: DC

## 2017-08-28 MED ORDER — ALBUTEROL SULFATE HFA 108 (90 BASE) MCG/ACT IN AERS
2.0000 | INHALATION_SPRAY | RESPIRATORY_TRACT | Status: AC
  Administered 2017-08-28: 2 via RESPIRATORY_TRACT
  Filled 2017-08-28: qty 6.7

## 2017-08-28 MED ORDER — BENZONATATE 100 MG PO CAPS: 100.0000 mg | ORAL_CAPSULE | Freq: Three times a day (TID) | ORAL | 0 refills | Status: DC

## 2017-08-28 MED ORDER — BENZONATATE 100 MG PO CAPS
200.0000 mg | ORAL_CAPSULE | Freq: Once | ORAL | Status: AC
  Administered 2017-08-28: 200 mg via ORAL
  Filled 2017-08-28: qty 2

## 2017-08-28 NOTE — ED Triage Notes (Signed)
Pt reports having a productive cough with chills and yesterday developed shortness of breath. Pt has had a cold and congestion for over 3 weeks now that seems to be progressing.

## 2017-08-28 NOTE — Discharge Instructions (Signed)
Albuterol every 4 hours as needed for coughing or shortness of breath (2 puffs)  Tessalon every 8 hours for coughing  Prednisone 40mg  daily for 5 days  Start the Zithromax in 3 days if no improvement.  Please obtain all of your results from medical records or have your doctors office obtain the results - share them with your doctor - you should be seen at your doctors office in the next 2 days. Call today to arrange your follow up. Take the medications as prescribed. Please review all of the medicines and only take them if you do not have an allergy to them. Please be aware that if you are taking birth control pills, taking other prescriptions, ESPECIALLY ANTIBIOTICS may make the birth control ineffective - if this is the case, either do not engage in sexual activity or use alternative methods of birth control such as condoms until you have finished the medicine and your family doctor says it is OK to restart them. If you are on a blood thinner such as COUMADIN, be aware that any other medicine that you take may cause the coumadin to either work too much, or not enough - you should have your coumadin level rechecked in next 7 days if this is the case.  ?  It is also a possibility that you have an allergic reaction to any of the medicines that you have been prescribed - Everybody reacts differently to medications and while MOST people have no trouble with most medicines, you may have a reaction such as nausea, vomiting, rash, swelling, shortness of breath. If this is the case, please stop taking the medicine immediately and contact your physician.  ?  You should return to the ER if you develop severe or worsening symptoms.

## 2017-08-28 NOTE — ED Provider Notes (Signed)
[facility]ENC[facility] DEPART[facility] Provider Note   CSN: [id number] Arrival date & time: 08/28/17  7902     History   Chief Complaint Chief Complaint  Patient presents with  . Shortness of Breath  . Cough    HPI Rebecca Barber is a 67 y.o. female.  HPI  The patient is a 67 year old female, she is generally healthy and reports a history of hypothyroidism and no significant prior pulmonary or cardiac history.  She is followed regularly by her doctor.  Approximately 3 weeks ago before getting on a cruise she started to develop some increased amounts of coughing.  The coughing started abruptly and has been persistent though at a low level until recently when the coughing became much worse.  She has been coughing increasing amounts over the last couple of days and last night felt short of breath like she could not get enough air.  She feels better this morning.  She has not been taking any specific prescription medications but has been taking some over-the-counter cough suppressants with minimal relief.  Her husband was recently sick with similar symptoms and lasted for 3 weeks of coughing as well.  She saw the physician on her cruise ship who recommended that she have an antibiotic and a cough suppressant but she refused at that time.  She denies fevers, denies swelling of the legs, denies nausea vomiting or sore throat or nasal congestion.  She has been trying to do her normal activities including riding a spin bike as well as playing tennis  Past Medical History:  Diagnosis Date  . Adenomatous colon polyp 07/2014  . Cyst in "bone marrow" in wrist  . Depression h/o in the 90's due to hypothyroid;also h/o pain as a result of thyroid not beingcontrolled for many years, per pt   pt denies. she said thyroid made her sleep and have pain.  Marland Kitchen GERD (gastroesophageal reflux disease)   . Hyperlipidemia   . Insomnia   . Postmenopausal   . S/P catheter ablation of slow pathway EP  stud w/RF catheter ablation of accessory pathway with [facility]Taylor 5/01  . Thyroid disease hypothyroidism    Patient Active Problem List   Diagnosis Date Noted  . Unspecified hypothyroidism 01/23/2011  . Pure hypercholesterolemia 01/23/2011  . Insomnia 01/23/2011    Past Surgical History:  Procedure Laterality Date  . COLONOSCOPY    . ganglion cyst  left foot ,age 89  . heart ablation  2007   misfire and circulate   . injury of left meniscus  Dr Alvan Dame 02/2008   no surgery   . ROTATOR CUFF REPAIR  RIGHT,Dr.Norris    OB History    Gravida Para Term Preterm AB Living   4 4 4          SAB TAB Ectopic Multiple Live Births                   Home Medications    Prior to Admission medications   Medication Sig Start Date End Date Taking? Authorizing Provider  albuterol (PROVENTIL HFA;VENTOLIN HFA) 108 (90 Base) MCG/ACT inhaler Inhale 2 puffs into the lungs every 4 (four) hours as needed for wheezing or shortness of breath. 08/28/17   Noemi Chapel, MD  azithromycin (ZITHROMAX Z-PAK) 250 MG tablet Take 1 tablet (250 mg total) by mouth daily. 500mg  PO day 1, then 250mg  PO days 205 08/28/17   Noemi Chapel, MD  b complex vitamins capsule Take 1 capsule by mouth daily.  [provider]  benzonatate (TESSALON) 100 MG capsule Take 1 capsule (100 mg total) by mouth every 8 (eight) hours. 08/28/17   Noemi Chapel, MD  Calcium Carb-Cholecalciferol (CALCIUM + D3 PO) Take 500 mg by mouth daily.    [provider]  Magnesium 500 MG TABS Take 500 mg by mouth daily.    [provider]  Multiple Vitamin (MULTIVITAMIN) tablet Take 1 tablet by mouth daily.    [provider]  predniSONE (DELTASONE) 20 MG tablet Take 2 tablets (40 mg total) by mouth daily. 08/28/17   Noemi Chapel, MD  SYNTHROID 88 MCG tablet Take 1 tablet (88 mcg total) by mouth daily. 01/23/11   Rita Ohara, MD  traZODone (DESYREL) 150 MG tablet Take 1 tablet (150 mg total) by mouth at bedtime. 01/23/11    Rita Ohara, MD    Family History Family History  Problem Relation Age of Onset  . Stroke Mother   . Transient ischemic attack Mother   . Stroke Father   . Aneurysm Father        brain  . Heart attack Brother   . Schizophrenia Brother   . Eating disorder Daughter   . Cancer Brother        parathyroid and agent orange  . Depression Brother   . Fibromyalgia Daughter   . Cancer Cousin        breast CA in her 28's  . Colon cancer Neg Hx   . Colon polyps Neg Hx     Social History Social History   Tobacco Use  . Smoking status: Never Smoker  . Smokeless tobacco: Never Used  Substance Use Topics  . Alcohol use: Yes    Comment: 2-3 glasses of wine weekly  . Drug use: No     Allergies   Celebrex [celecoxib]   Review of Systems Review of Systems  All other systems reviewed and are negative.    Physical Exam Updated Vital Signs BP 131/67   Pulse 88   Temp 99.5 F (37.5 C) (Oral)   Resp (!) 22   SpO2 94%   Physical Exam  Constitutional: She appears well-developed and well-nourished. No distress.  HENT:  Head: Normocephalic and atraumatic.  Mouth/Throat: Oropharynx is clear and moist. No oropharyngeal exudate.  Eyes: Conjunctivae and EOM are normal. Pupils are equal, round, and reactive to light. Right eye exhibits no discharge. Left eye exhibits no discharge. No scleral icterus.  Neck: Normal range of motion. Neck supple. No JVD present. No thyromegaly present.  Cardiovascular: Normal rate, regular rhythm, normal heart sounds and intact distal pulses. Exam reveals no gallop and no friction rub.  No murmur heard. Heart rate is 95 on my exam with normal pulses at the radial arteries  Pulmonary/Chest: Effort normal and breath sounds normal. No respiratory distress. She has no wheezes. She has no rales.  Lung sounds are clear without wheezing rhonchi or rales, speaks in full sentences with no distress  Abdominal: Soft. Bowel sounds are normal. She exhibits no  distension and no mass. There is no tenderness.  Musculoskeletal: Normal range of motion. She exhibits no edema or tenderness.  No peripheral edema  Lymphadenopathy:    She has no cervical adenopathy.  Neurological: She is alert. Coordination normal.  Skin: Skin is warm and dry. No rash noted. No erythema.  Psychiatric: She has a normal mood and affect. Her behavior is normal.  Nursing note and vitals reviewed.    ED Treatments / Results  Labs (all  labs ordered are listed, but only abnormal results are displayed) Labs Reviewed  BASIC METABOLIC PANEL - Abnormal; Notable for the following components:      Result Value   Glucose, Bld 128 (*)    All other components within normal limits  CBC - Abnormal; Notable for the following components:   WBC 11.1 (*)    All other components within normal limits  I-STAT TROPONIN, ED    EKG  EKG Interpretation  Date/Time:  Friday August 28 2017 07:05:54 EST Ventricular Rate:  114 PR Interval:  126 QRS Duration: 74 QT Interval:  324 QTC Calculation: 446 R Axis:   63 Text Interpretation:  Sinus tachycardia Otherwise normal ECG Since last tracing rate faster Confirmed by Noemi Chapel [phone]) on 08/28/2017 7:50:35 AM       Radiology Dg Chest 2 View  Result Date: 08/28/2017 CLINICAL DATA:  Fever with cough and congestion EXAM: CHEST  2 VIEW COMPARISON:  None. FINDINGS: There is a small calcified granuloma in the left upper lobe. The lungs elsewhere are clear. The heart size and pulmonary vascularity are normal. No adenopathy. There is mild degenerative change in the thoracic spine. IMPRESSION: Small left upper lobe calcified granuloma. No edema or consolidation. Heart size normal. Electronically Signed   By: Lowella Grip III M.D.   On: 08/28/2017 07:58    Procedures Procedures (including critical care time)  Medications Ordered in ED Medications  benzonatate (TESSALON) capsule 200 mg (200 mg Oral Given 08/28/17 0839)  albuterol  (PROVENTIL HFA;VENTOLIN HFA) 108 (90 Base) MCG/ACT inhaler 2 puff (2 puffs Inhalation Given 08/28/17 0840)     Initial Impression / Assessment and Plan / ED Course  I have reviewed the triage vital signs and the nursing notes.  Pertinent labs & imaging results that were available during my care of the patient were reviewed by me and considered in my medical decision making (see chart for details).     The patient has an x-ray showing a small left upper lobe calcified granuloma but no signs of acute infection.  The patient can be followed up with her family doctor for these findings.  Her labs show a white blood cell count of 11,100 and negative troponin.  Her x-ray shows no signs of pneumonia, pneumothorax or other concerning findings.  Prednisone Tessalon Albuterol  Discussed with the patient the utility of antibiotics and bronchitis and how there is minimal improvement, given the length of time that she has had symptoms and her recent worsening of symptoms I have suggested the use of an antibiotic if she does not get better after several days with these medications and she has agreed to that plan, she does not want an antibiotic at this time.  I think that is appropriate given that she is afebrile, no findings on x-ray and minimal leukocytosis.  BMP normal Will d/c home with supportive meds as above and below  Final Clinical Impressions(s) / ED Diagnoses   Final diagnoses:  Acute bronchitis, unspecified organism    ED Discharge Orders        Ordered    predniSONE (DELTASONE) 20 MG tablet  Daily     08/28/17 0842    albuterol (PROVENTIL HFA;VENTOLIN HFA) 108 (90 Base) MCG/ACT inhaler  Every 4 hours PRN     08/28/17 0842    benzonatate (TESSALON) 100 MG capsule  Every 8 hours     08/28/17 0842    azithromycin (ZITHROMAX Z-PAK) 250 MG tablet  Daily  08/28/17 4680       Noemi Chapel, MD 08/28/17 [phone]

## 2017-10-01 DIAGNOSIS — H2513 Age-related nuclear cataract, bilateral: Secondary | ICD-10-CM | POA: Diagnosis not present

## 2017-11-04 DIAGNOSIS — D485 Neoplasm of uncertain behavior of skin: Secondary | ICD-10-CM | POA: Diagnosis not present

## 2017-11-04 DIAGNOSIS — C44329 Squamous cell carcinoma of skin of other parts of face: Secondary | ICD-10-CM | POA: Diagnosis not present

## 2017-11-11 DIAGNOSIS — M19049 Primary osteoarthritis, unspecified hand: Secondary | ICD-10-CM | POA: Insufficient documentation

## 2017-11-16 ENCOUNTER — Other Ambulatory Visit: Payer: Self-pay | Admitting: Obstetrics and Gynecology

## 2017-11-16 DIAGNOSIS — Z1231 Encounter for screening mammogram for malignant neoplasm of breast: Secondary | ICD-10-CM

## 2017-12-03 DIAGNOSIS — C44329 Squamous cell carcinoma of skin of other parts of face: Secondary | ICD-10-CM | POA: Diagnosis not present

## 2017-12-07 ENCOUNTER — Ambulatory Visit
Admission: RE | Admit: 2017-12-07 | Discharge: 2017-12-07 | Disposition: A | Discharge status: Home / Self Care | Payer: Medicare Other | Source: Ambulatory Visit | Attending: Obstetrics and Gynecology | Admitting: Obstetrics and Gynecology

## 2017-12-07 DIAGNOSIS — Z1231 Encounter for screening mammogram for malignant neoplasm of breast: Secondary | ICD-10-CM

## 2018-01-07 DIAGNOSIS — L239 Allergic contact dermatitis, unspecified cause: Secondary | ICD-10-CM | POA: Diagnosis not present

## 2018-01-10 DIAGNOSIS — L239 Allergic contact dermatitis, unspecified cause: Secondary | ICD-10-CM | POA: Diagnosis not present

## 2018-02-09 DIAGNOSIS — D0462 Carcinoma in situ of skin of left upper limb, including shoulder: Secondary | ICD-10-CM | POA: Diagnosis not present

## 2018-02-09 DIAGNOSIS — D485 Neoplasm of uncertain behavior of skin: Secondary | ICD-10-CM | POA: Diagnosis not present

## 2018-02-09 DIAGNOSIS — L905 Scar conditions and fibrosis of skin: Secondary | ICD-10-CM | POA: Diagnosis not present

## 2018-03-30 DIAGNOSIS — M25511 Pain in right shoulder: Secondary | ICD-10-CM | POA: Insufficient documentation

## 2018-04-20 DIAGNOSIS — D0462 Carcinoma in situ of skin of left upper limb, including shoulder: Secondary | ICD-10-CM | POA: Diagnosis not present

## 2018-04-28 DIAGNOSIS — M25511 Pain in right shoulder: Secondary | ICD-10-CM | POA: Diagnosis not present

## 2018-07-07 DIAGNOSIS — Z01419 Encounter for gynecological examination (general) (routine) without abnormal findings: Secondary | ICD-10-CM | POA: Diagnosis not present

## 2018-07-07 DIAGNOSIS — Z124 Encounter for screening for malignant neoplasm of cervix: Secondary | ICD-10-CM | POA: Diagnosis not present

## 2018-07-28 DIAGNOSIS — N950 Postmenopausal bleeding: Secondary | ICD-10-CM | POA: Diagnosis not present

## 2018-10-04 DIAGNOSIS — H2513 Age-related nuclear cataract, bilateral: Secondary | ICD-10-CM | POA: Diagnosis not present

## 2018-10-08 DIAGNOSIS — Z23 Encounter for immunization: Secondary | ICD-10-CM | POA: Diagnosis not present

## 2018-10-08 DIAGNOSIS — L723 Sebaceous cyst: Secondary | ICD-10-CM | POA: Diagnosis not present

## 2018-10-08 DIAGNOSIS — L821 Other seborrheic keratosis: Secondary | ICD-10-CM | POA: Diagnosis not present

## 2018-10-08 DIAGNOSIS — D2271 Melanocytic nevi of right lower limb, including hip: Secondary | ICD-10-CM | POA: Diagnosis not present

## 2018-10-21 DIAGNOSIS — Z23 Encounter for immunization: Secondary | ICD-10-CM | POA: Diagnosis not present

## 2018-10-25 DIAGNOSIS — Z23 Encounter for immunization: Secondary | ICD-10-CM | POA: Diagnosis not present

## 2018-12-30 DIAGNOSIS — L708 Other acne: Secondary | ICD-10-CM | POA: Diagnosis not present

## 2018-12-30 DIAGNOSIS — L814 Other melanin hyperpigmentation: Secondary | ICD-10-CM | POA: Diagnosis not present

## 2018-12-30 DIAGNOSIS — L719 Rosacea, unspecified: Secondary | ICD-10-CM | POA: Diagnosis not present

## 2019-03-08 DIAGNOSIS — H16292 Other keratoconjunctivitis, left eye: Secondary | ICD-10-CM | POA: Diagnosis not present

## 2019-07-01 ENCOUNTER — Encounter: Payer: Self-pay | Admitting: Gastroenterology

## 2019-09-16 ENCOUNTER — Ambulatory Visit: Payer: Medicare Other

## 2019-09-22 ENCOUNTER — Ambulatory Visit: Payer: Medicare Other | Attending: Internal Medicine

## 2019-09-22 DIAGNOSIS — Z23 Encounter for immunization: Secondary | ICD-10-CM

## 2019-09-22 NOTE — Progress Notes (Signed)
   Covid-19 Vaccination Clinic  Name:  Rebecca Barber    MRN: [id number] DOB: [birth year]  09/22/2019  Ms. Rebecca Barber was observed post Covid-19 immunization for 15 minutes without incidence. She was provided with Vaccine Information Sheet and instruction to access the V-Safe system.   Ms. Rebecca Barber was instructed to call 911 with any severe reactions post vaccine: Marland Kitchen Difficulty breathing  . Swelling of your face and throat  . A fast heartbeat  . A bad rash all over your body  . Dizziness and weakness    Immunizations Administered    Name Date Dose VIS Date Route   Pfizer COVID-19 Vaccine 09/22/2019  2:05 PM 0.3 mL 07/29/2019 Intramuscular   Manufacturer: [place]   Lot: [id number]   [place]: [id number]

## 2019-10-03 ENCOUNTER — Ambulatory Visit: Payer: Medicare Other

## 2019-10-11 DIAGNOSIS — H2513 Age-related nuclear cataract, bilateral: Secondary | ICD-10-CM | POA: Diagnosis not present

## 2019-10-17 ENCOUNTER — Ambulatory Visit: Payer: Medicare Other | Attending: Internal Medicine

## 2019-10-17 DIAGNOSIS — Z23 Encounter for immunization: Secondary | ICD-10-CM | POA: Insufficient documentation

## 2019-10-17 NOTE — Progress Notes (Signed)
   Covid-19 Vaccination Clinic  Name:  Rebecca Barber    MRN: [id number] DOB: 1951/01/27  10/17/2019  Rebecca Barber was observed post Covid-19 immunization for 15 minutes without incidence. She was provided with Vaccine Information Sheet and instruction to access the V-Safe system.   Rebecca Barber was instructed to call 911 with any severe reactions post vaccine: Marland Kitchen Difficulty breathing  . Swelling of your face and throat  . A fast heartbeat  . A bad rash all over your body  . Dizziness and weakness    Immunizations Administered    Name Date Dose VIS Date Route   Pfizer COVID-19 Vaccine 10/17/2019  4:43 PM 0.3 mL 07/29/2019 Intramuscular   Manufacturer: [place]   Lot: [id number]   [place]: [id number]

## 2019-10-20 DIAGNOSIS — L821 Other seborrheic keratosis: Secondary | ICD-10-CM | POA: Diagnosis not present

## 2019-10-20 DIAGNOSIS — I781 Nevus, non-neoplastic: Secondary | ICD-10-CM | POA: Diagnosis not present

## 2019-10-20 DIAGNOSIS — L570 Actinic keratosis: Secondary | ICD-10-CM | POA: Diagnosis not present

## 2019-10-27 DIAGNOSIS — M9902 Segmental and somatic dysfunction of thoracic region: Secondary | ICD-10-CM | POA: Diagnosis not present

## 2019-10-27 DIAGNOSIS — M542 Cervicalgia: Secondary | ICD-10-CM | POA: Diagnosis not present

## 2019-10-27 DIAGNOSIS — M50322 Other cervical disc degeneration at C5-C6 level: Secondary | ICD-10-CM | POA: Diagnosis not present

## 2019-10-27 DIAGNOSIS — M9901 Segmental and somatic dysfunction of cervical region: Secondary | ICD-10-CM | POA: Diagnosis not present

## 2019-10-31 DIAGNOSIS — M9901 Segmental and somatic dysfunction of cervical region: Secondary | ICD-10-CM | POA: Diagnosis not present

## 2019-10-31 DIAGNOSIS — M50322 Other cervical disc degeneration at C5-C6 level: Secondary | ICD-10-CM | POA: Diagnosis not present

## 2019-10-31 DIAGNOSIS — M9902 Segmental and somatic dysfunction of thoracic region: Secondary | ICD-10-CM | POA: Diagnosis not present

## 2019-10-31 DIAGNOSIS — M542 Cervicalgia: Secondary | ICD-10-CM | POA: Diagnosis not present

## 2019-11-02 DIAGNOSIS — M9902 Segmental and somatic dysfunction of thoracic region: Secondary | ICD-10-CM | POA: Diagnosis not present

## 2019-11-02 DIAGNOSIS — M9901 Segmental and somatic dysfunction of cervical region: Secondary | ICD-10-CM | POA: Diagnosis not present

## 2019-11-02 DIAGNOSIS — M542 Cervicalgia: Secondary | ICD-10-CM | POA: Diagnosis not present

## 2019-11-02 DIAGNOSIS — M50322 Other cervical disc degeneration at C5-C6 level: Secondary | ICD-10-CM | POA: Diagnosis not present

## 2019-11-04 DIAGNOSIS — M9902 Segmental and somatic dysfunction of thoracic region: Secondary | ICD-10-CM | POA: Diagnosis not present

## 2019-11-04 DIAGNOSIS — M9901 Segmental and somatic dysfunction of cervical region: Secondary | ICD-10-CM | POA: Diagnosis not present

## 2019-11-04 DIAGNOSIS — M542 Cervicalgia: Secondary | ICD-10-CM | POA: Diagnosis not present

## 2019-11-04 DIAGNOSIS — M50322 Other cervical disc degeneration at C5-C6 level: Secondary | ICD-10-CM | POA: Diagnosis not present

## 2019-11-07 DIAGNOSIS — M9901 Segmental and somatic dysfunction of cervical region: Secondary | ICD-10-CM | POA: Diagnosis not present

## 2019-11-07 DIAGNOSIS — M542 Cervicalgia: Secondary | ICD-10-CM | POA: Diagnosis not present

## 2019-11-07 DIAGNOSIS — M9902 Segmental and somatic dysfunction of thoracic region: Secondary | ICD-10-CM | POA: Diagnosis not present

## 2019-11-07 DIAGNOSIS — M50322 Other cervical disc degeneration at C5-C6 level: Secondary | ICD-10-CM | POA: Diagnosis not present

## 2019-11-09 DIAGNOSIS — M50322 Other cervical disc degeneration at C5-C6 level: Secondary | ICD-10-CM | POA: Diagnosis not present

## 2019-11-09 DIAGNOSIS — M9901 Segmental and somatic dysfunction of cervical region: Secondary | ICD-10-CM | POA: Diagnosis not present

## 2019-11-09 DIAGNOSIS — M9902 Segmental and somatic dysfunction of thoracic region: Secondary | ICD-10-CM | POA: Diagnosis not present

## 2019-11-09 DIAGNOSIS — M542 Cervicalgia: Secondary | ICD-10-CM | POA: Diagnosis not present

## 2019-11-11 DIAGNOSIS — M50322 Other cervical disc degeneration at C5-C6 level: Secondary | ICD-10-CM | POA: Diagnosis not present

## 2019-11-11 DIAGNOSIS — M542 Cervicalgia: Secondary | ICD-10-CM | POA: Diagnosis not present

## 2019-11-11 DIAGNOSIS — M9901 Segmental and somatic dysfunction of cervical region: Secondary | ICD-10-CM | POA: Diagnosis not present

## 2019-11-11 DIAGNOSIS — M9902 Segmental and somatic dysfunction of thoracic region: Secondary | ICD-10-CM | POA: Diagnosis not present

## 2019-11-14 DIAGNOSIS — M9901 Segmental and somatic dysfunction of cervical region: Secondary | ICD-10-CM | POA: Diagnosis not present

## 2019-11-14 DIAGNOSIS — M9902 Segmental and somatic dysfunction of thoracic region: Secondary | ICD-10-CM | POA: Diagnosis not present

## 2019-11-14 DIAGNOSIS — M50322 Other cervical disc degeneration at C5-C6 level: Secondary | ICD-10-CM | POA: Diagnosis not present

## 2019-11-14 DIAGNOSIS — M542 Cervicalgia: Secondary | ICD-10-CM | POA: Diagnosis not present

## 2019-11-16 DIAGNOSIS — M9902 Segmental and somatic dysfunction of thoracic region: Secondary | ICD-10-CM | POA: Diagnosis not present

## 2019-11-16 DIAGNOSIS — M9901 Segmental and somatic dysfunction of cervical region: Secondary | ICD-10-CM | POA: Diagnosis not present

## 2019-11-16 DIAGNOSIS — M50322 Other cervical disc degeneration at C5-C6 level: Secondary | ICD-10-CM | POA: Diagnosis not present

## 2019-11-16 DIAGNOSIS — M542 Cervicalgia: Secondary | ICD-10-CM | POA: Diagnosis not present

## 2019-11-28 DIAGNOSIS — M50322 Other cervical disc degeneration at C5-C6 level: Secondary | ICD-10-CM | POA: Diagnosis not present

## 2019-11-28 DIAGNOSIS — M9902 Segmental and somatic dysfunction of thoracic region: Secondary | ICD-10-CM | POA: Diagnosis not present

## 2019-11-28 DIAGNOSIS — M542 Cervicalgia: Secondary | ICD-10-CM | POA: Diagnosis not present

## 2019-11-28 DIAGNOSIS — M9901 Segmental and somatic dysfunction of cervical region: Secondary | ICD-10-CM | POA: Diagnosis not present

## 2019-11-29 DIAGNOSIS — M50322 Other cervical disc degeneration at C5-C6 level: Secondary | ICD-10-CM | POA: Diagnosis not present

## 2019-11-29 DIAGNOSIS — M542 Cervicalgia: Secondary | ICD-10-CM | POA: Diagnosis not present

## 2019-11-29 DIAGNOSIS — M9902 Segmental and somatic dysfunction of thoracic region: Secondary | ICD-10-CM | POA: Diagnosis not present

## 2019-11-29 DIAGNOSIS — M9901 Segmental and somatic dysfunction of cervical region: Secondary | ICD-10-CM | POA: Diagnosis not present

## 2019-11-30 DIAGNOSIS — M9901 Segmental and somatic dysfunction of cervical region: Secondary | ICD-10-CM | POA: Diagnosis not present

## 2019-11-30 DIAGNOSIS — M542 Cervicalgia: Secondary | ICD-10-CM | POA: Diagnosis not present

## 2019-11-30 DIAGNOSIS — M9902 Segmental and somatic dysfunction of thoracic region: Secondary | ICD-10-CM | POA: Diagnosis not present

## 2019-11-30 DIAGNOSIS — M50322 Other cervical disc degeneration at C5-C6 level: Secondary | ICD-10-CM | POA: Diagnosis not present

## 2019-12-08 IMAGING — CR DG CHEST 2V
2 series · 2 of 2 positions shown · non-contrast
Comparison: None.

CLINICAL DATA: Fever with cough and congestion

EXAM:
CHEST  2 VIEW

[chest pa]
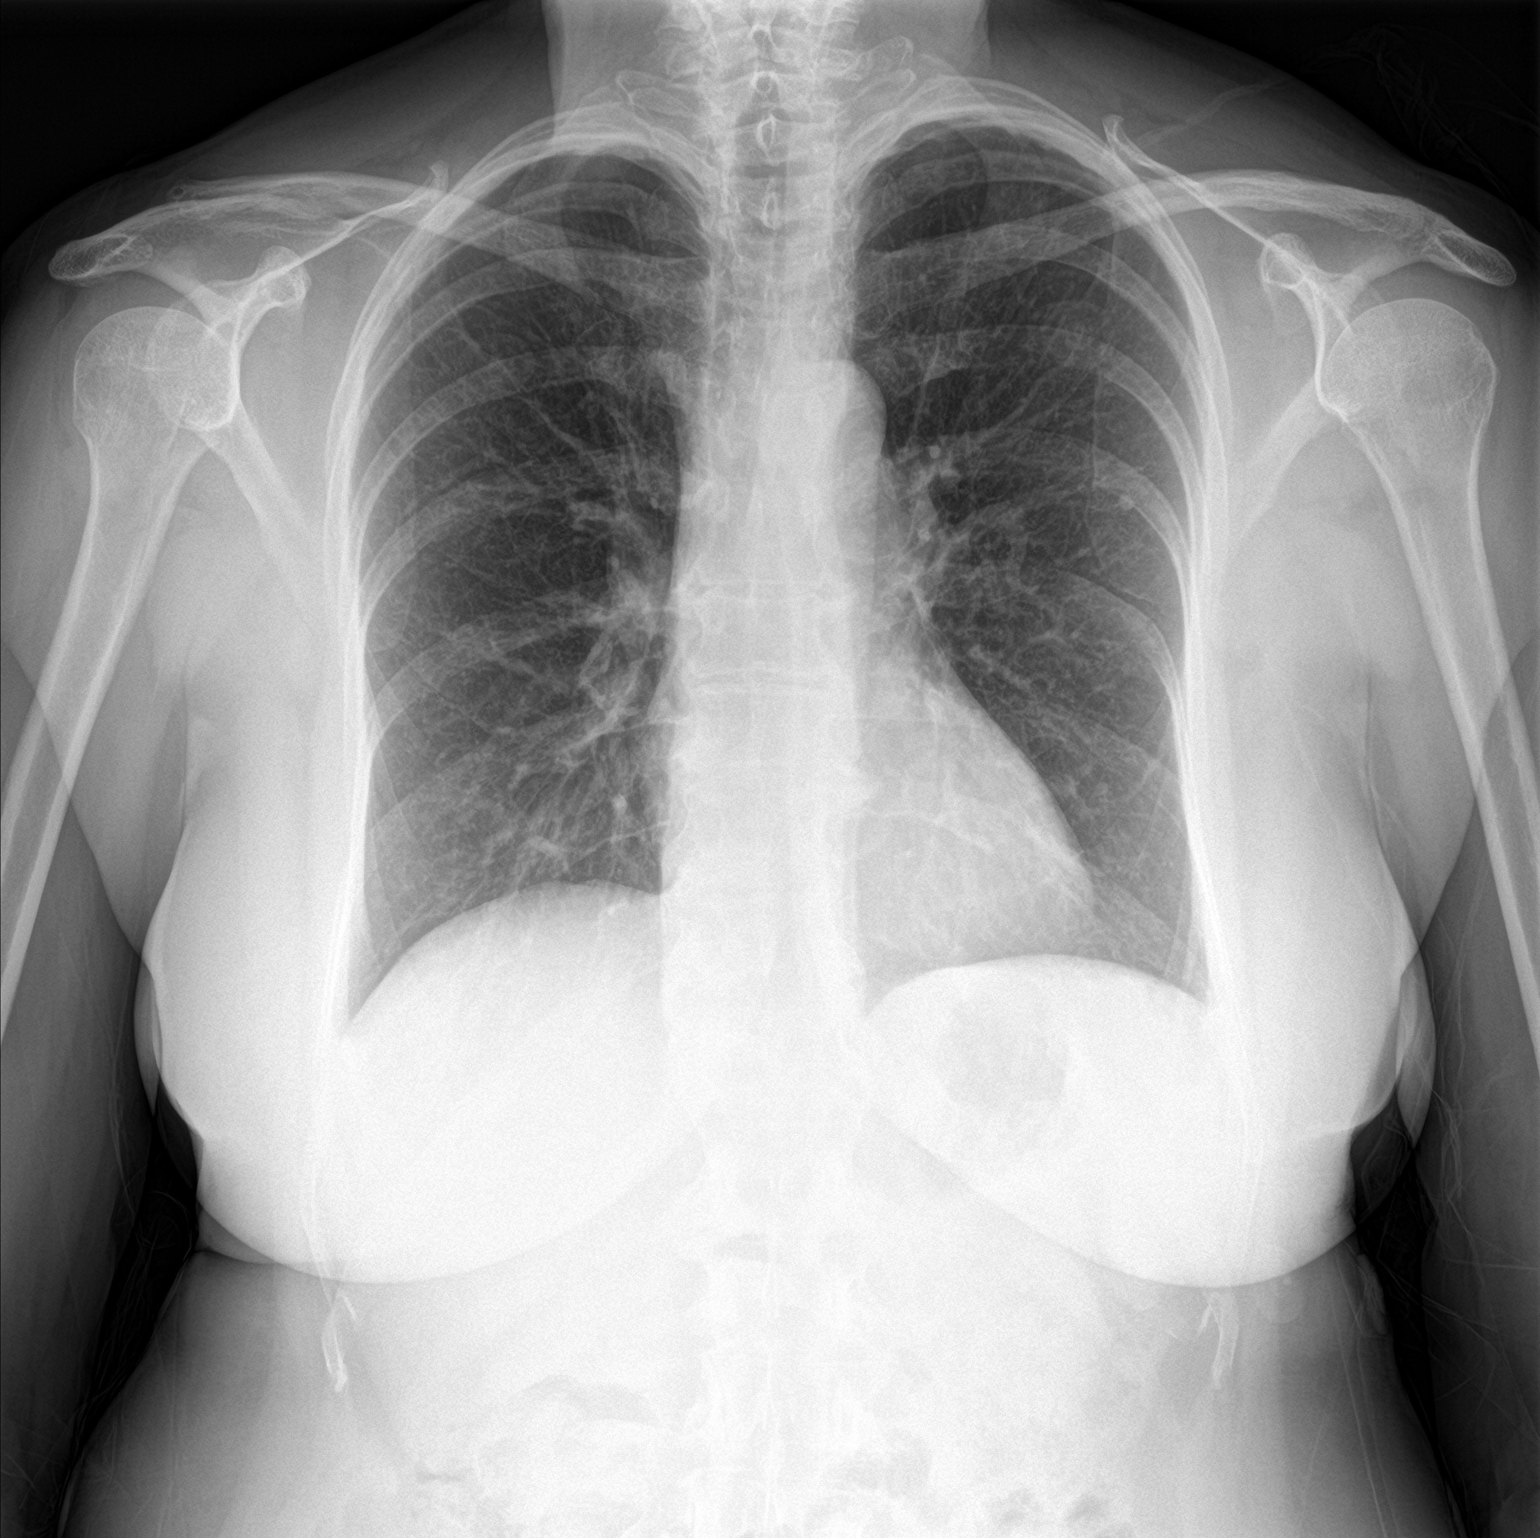

[chest lat]
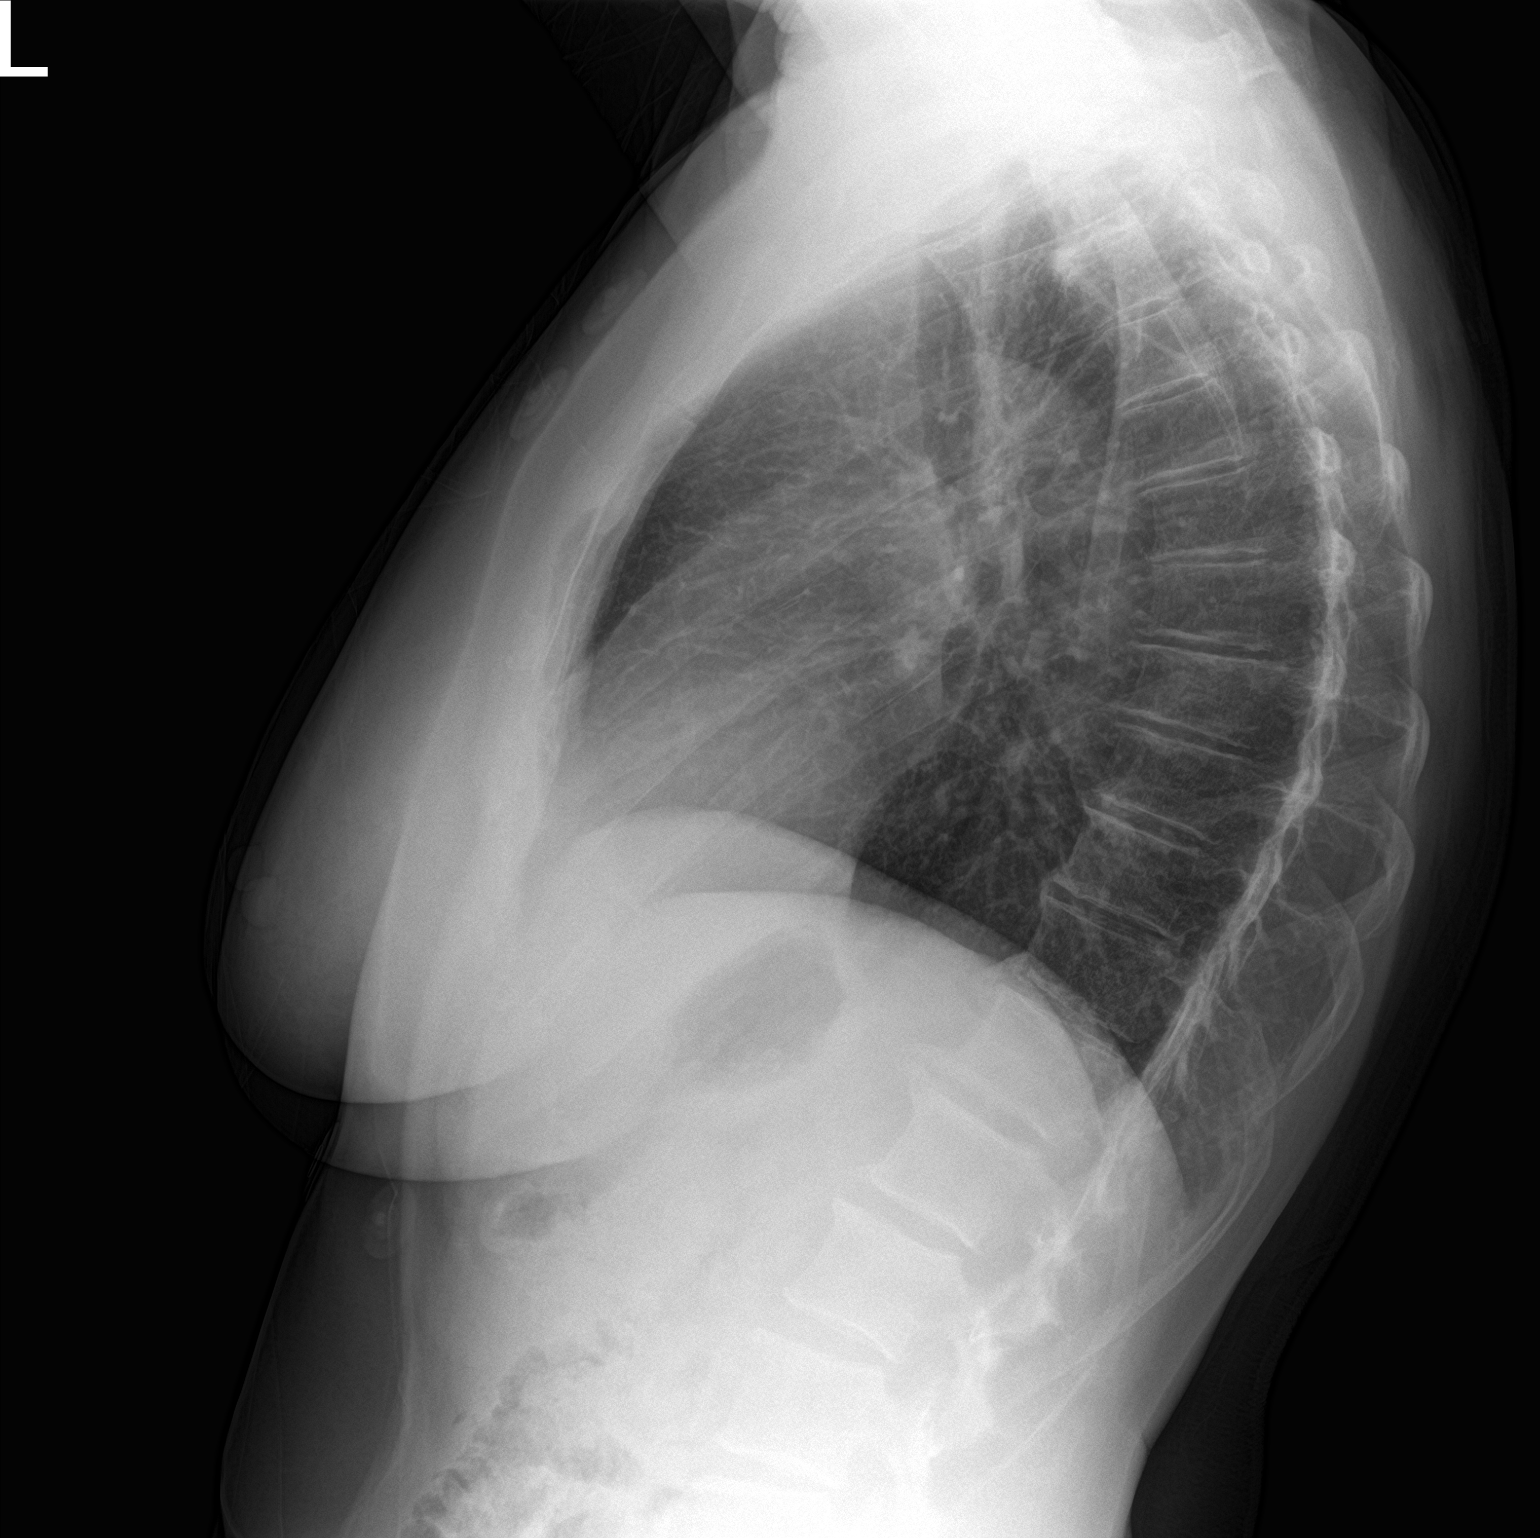

[2 of 2 positions shown; findings below may reference images not displayed]

FINDINGS: There is a small calcified granuloma in the left upper lobe. The
lungs elsewhere are clear. The heart size and pulmonary vascularity
are normal. No adenopathy. There is mild degenerative change in the
thoracic spine.
IMPRESSION: Small left upper lobe calcified granuloma. No edema or
consolidation. Heart size normal.

## 2019-12-09 DIAGNOSIS — M9902 Segmental and somatic dysfunction of thoracic region: Secondary | ICD-10-CM | POA: Diagnosis not present

## 2019-12-09 DIAGNOSIS — M50322 Other cervical disc degeneration at C5-C6 level: Secondary | ICD-10-CM | POA: Diagnosis not present

## 2019-12-09 DIAGNOSIS — M9901 Segmental and somatic dysfunction of cervical region: Secondary | ICD-10-CM | POA: Diagnosis not present

## 2019-12-09 DIAGNOSIS — M542 Cervicalgia: Secondary | ICD-10-CM | POA: Diagnosis not present

## 2019-12-12 DIAGNOSIS — M50322 Other cervical disc degeneration at C5-C6 level: Secondary | ICD-10-CM | POA: Diagnosis not present

## 2019-12-12 DIAGNOSIS — M9901 Segmental and somatic dysfunction of cervical region: Secondary | ICD-10-CM | POA: Diagnosis not present

## 2019-12-12 DIAGNOSIS — M9902 Segmental and somatic dysfunction of thoracic region: Secondary | ICD-10-CM | POA: Diagnosis not present

## 2019-12-12 DIAGNOSIS — M542 Cervicalgia: Secondary | ICD-10-CM | POA: Diagnosis not present

## 2019-12-14 DIAGNOSIS — M9901 Segmental and somatic dysfunction of cervical region: Secondary | ICD-10-CM | POA: Diagnosis not present

## 2019-12-14 DIAGNOSIS — M50322 Other cervical disc degeneration at C5-C6 level: Secondary | ICD-10-CM | POA: Diagnosis not present

## 2019-12-14 DIAGNOSIS — M542 Cervicalgia: Secondary | ICD-10-CM | POA: Diagnosis not present

## 2019-12-14 DIAGNOSIS — M9902 Segmental and somatic dysfunction of thoracic region: Secondary | ICD-10-CM | POA: Diagnosis not present

## 2019-12-16 DIAGNOSIS — M9901 Segmental and somatic dysfunction of cervical region: Secondary | ICD-10-CM | POA: Diagnosis not present

## 2019-12-16 DIAGNOSIS — M50322 Other cervical disc degeneration at C5-C6 level: Secondary | ICD-10-CM | POA: Diagnosis not present

## 2019-12-16 DIAGNOSIS — M542 Cervicalgia: Secondary | ICD-10-CM | POA: Diagnosis not present

## 2019-12-16 DIAGNOSIS — M9902 Segmental and somatic dysfunction of thoracic region: Secondary | ICD-10-CM | POA: Diagnosis not present

## 2019-12-19 DIAGNOSIS — M542 Cervicalgia: Secondary | ICD-10-CM | POA: Diagnosis not present

## 2019-12-19 DIAGNOSIS — M9902 Segmental and somatic dysfunction of thoracic region: Secondary | ICD-10-CM | POA: Diagnosis not present

## 2019-12-19 DIAGNOSIS — M9901 Segmental and somatic dysfunction of cervical region: Secondary | ICD-10-CM | POA: Diagnosis not present

## 2019-12-19 DIAGNOSIS — M50322 Other cervical disc degeneration at C5-C6 level: Secondary | ICD-10-CM | POA: Diagnosis not present

## 2020-02-24 DIAGNOSIS — M50322 Other cervical disc degeneration at C5-C6 level: Secondary | ICD-10-CM | POA: Diagnosis not present

## 2020-02-24 DIAGNOSIS — M542 Cervicalgia: Secondary | ICD-10-CM | POA: Diagnosis not present

## 2020-02-24 DIAGNOSIS — M9902 Segmental and somatic dysfunction of thoracic region: Secondary | ICD-10-CM | POA: Diagnosis not present

## 2020-02-24 DIAGNOSIS — M9901 Segmental and somatic dysfunction of cervical region: Secondary | ICD-10-CM | POA: Diagnosis not present

## 2020-02-27 DIAGNOSIS — M542 Cervicalgia: Secondary | ICD-10-CM | POA: Diagnosis not present

## 2020-02-27 DIAGNOSIS — M9902 Segmental and somatic dysfunction of thoracic region: Secondary | ICD-10-CM | POA: Diagnosis not present

## 2020-02-27 DIAGNOSIS — M9901 Segmental and somatic dysfunction of cervical region: Secondary | ICD-10-CM | POA: Diagnosis not present

## 2020-02-27 DIAGNOSIS — M50322 Other cervical disc degeneration at C5-C6 level: Secondary | ICD-10-CM | POA: Diagnosis not present

## 2020-03-02 DIAGNOSIS — M50322 Other cervical disc degeneration at C5-C6 level: Secondary | ICD-10-CM | POA: Diagnosis not present

## 2020-03-02 DIAGNOSIS — M542 Cervicalgia: Secondary | ICD-10-CM | POA: Diagnosis not present

## 2020-03-02 DIAGNOSIS — M9902 Segmental and somatic dysfunction of thoracic region: Secondary | ICD-10-CM | POA: Diagnosis not present

## 2020-03-02 DIAGNOSIS — M9901 Segmental and somatic dysfunction of cervical region: Secondary | ICD-10-CM | POA: Diagnosis not present

## 2020-03-12 DIAGNOSIS — D485 Neoplasm of uncertain behavior of skin: Secondary | ICD-10-CM | POA: Diagnosis not present

## 2020-03-12 DIAGNOSIS — L570 Actinic keratosis: Secondary | ICD-10-CM | POA: Diagnosis not present

## 2020-03-19 DIAGNOSIS — M7662 Achilles tendinitis, left leg: Secondary | ICD-10-CM | POA: Diagnosis not present

## 2020-03-23 DIAGNOSIS — M9902 Segmental and somatic dysfunction of thoracic region: Secondary | ICD-10-CM | POA: Diagnosis not present

## 2020-03-23 DIAGNOSIS — M9901 Segmental and somatic dysfunction of cervical region: Secondary | ICD-10-CM | POA: Diagnosis not present

## 2020-03-23 DIAGNOSIS — M542 Cervicalgia: Secondary | ICD-10-CM | POA: Diagnosis not present

## 2020-03-23 DIAGNOSIS — M50322 Other cervical disc degeneration at C5-C6 level: Secondary | ICD-10-CM | POA: Diagnosis not present

## 2020-04-19 DIAGNOSIS — M7662 Achilles tendinitis, left leg: Secondary | ICD-10-CM | POA: Insufficient documentation

## 2020-05-01 DIAGNOSIS — M7662 Achilles tendinitis, left leg: Secondary | ICD-10-CM | POA: Diagnosis not present

## 2020-05-04 DIAGNOSIS — C44622 Squamous cell carcinoma of skin of right upper limb, including shoulder: Secondary | ICD-10-CM | POA: Diagnosis not present

## 2020-05-04 DIAGNOSIS — D485 Neoplasm of uncertain behavior of skin: Secondary | ICD-10-CM | POA: Diagnosis not present

## 2020-05-04 DIAGNOSIS — L080 Pyoderma: Secondary | ICD-10-CM | POA: Diagnosis not present

## 2020-05-04 DIAGNOSIS — L570 Actinic keratosis: Secondary | ICD-10-CM | POA: Diagnosis not present

## 2020-05-04 DIAGNOSIS — L905 Scar conditions and fibrosis of skin: Secondary | ICD-10-CM | POA: Diagnosis not present

## 2020-05-04 DIAGNOSIS — S20419A Abrasion of unspecified back wall of thorax, initial encounter: Secondary | ICD-10-CM | POA: Diagnosis not present

## 2020-05-04 DIAGNOSIS — L578 Other skin changes due to chronic exposure to nonionizing radiation: Secondary | ICD-10-CM | POA: Diagnosis not present

## 2020-05-08 DIAGNOSIS — M7662 Achilles tendinitis, left leg: Secondary | ICD-10-CM | POA: Diagnosis not present

## 2020-05-11 DIAGNOSIS — M7662 Achilles tendinitis, left leg: Secondary | ICD-10-CM | POA: Diagnosis not present

## 2020-05-14 ENCOUNTER — Encounter: Payer: Self-pay | Admitting: Gastroenterology

## 2020-05-14 ENCOUNTER — Other Ambulatory Visit: Payer: Self-pay | Admitting: Obstetrics and Gynecology

## 2020-05-14 DIAGNOSIS — Z1231 Encounter for screening mammogram for malignant neoplasm of breast: Secondary | ICD-10-CM

## 2020-05-15 DIAGNOSIS — M7662 Achilles tendinitis, left leg: Secondary | ICD-10-CM | POA: Diagnosis not present

## 2020-05-16 ENCOUNTER — Other Ambulatory Visit: Payer: Self-pay | Admitting: Orthopedic Surgery

## 2020-05-16 DIAGNOSIS — M25572 Pain in left ankle and joints of left foot: Secondary | ICD-10-CM

## 2020-05-17 DIAGNOSIS — M7662 Achilles tendinitis, left leg: Secondary | ICD-10-CM | POA: Diagnosis not present

## 2020-05-22 DIAGNOSIS — L905 Scar conditions and fibrosis of skin: Secondary | ICD-10-CM | POA: Diagnosis not present

## 2020-05-22 DIAGNOSIS — C44622 Squamous cell carcinoma of skin of right upper limb, including shoulder: Secondary | ICD-10-CM | POA: Diagnosis not present

## 2020-05-24 DIAGNOSIS — M7662 Achilles tendinitis, left leg: Secondary | ICD-10-CM | POA: Diagnosis not present

## 2020-06-05 ENCOUNTER — Ambulatory Visit
Admission: RE | Admit: 2020-06-05 | Discharge: 2020-06-05 | Disposition: A | Discharge status: Home / Self Care | Payer: Medicare Other | Source: Ambulatory Visit | Attending: Orthopedic Surgery | Admitting: Orthopedic Surgery

## 2020-06-05 ENCOUNTER — Other Ambulatory Visit: Payer: Self-pay

## 2020-06-05 DIAGNOSIS — M25572 Pain in left ankle and joints of left foot: Secondary | ICD-10-CM

## 2020-06-13 DIAGNOSIS — M898X7 Other specified disorders of bone, ankle and foot: Secondary | ICD-10-CM | POA: Diagnosis not present

## 2020-06-13 DIAGNOSIS — M7730 Calcaneal spur, unspecified foot: Secondary | ICD-10-CM | POA: Insufficient documentation

## 2020-06-13 DIAGNOSIS — M7662 Achilles tendinitis, left leg: Secondary | ICD-10-CM | POA: Diagnosis not present

## 2020-06-20 ENCOUNTER — Encounter: Payer: Medicare Other | Admitting: Gastroenterology

## 2020-06-26 ENCOUNTER — Ambulatory Visit: Payer: Medicare Other

## 2020-07-09 DIAGNOSIS — N952 Postmenopausal atrophic vaginitis: Secondary | ICD-10-CM | POA: Diagnosis not present

## 2020-07-09 DIAGNOSIS — Z01419 Encounter for gynecological examination (general) (routine) without abnormal findings: Secondary | ICD-10-CM | POA: Diagnosis not present

## 2020-07-09 DIAGNOSIS — Z780 Asymptomatic menopausal state: Secondary | ICD-10-CM | POA: Diagnosis not present

## 2020-07-23 ENCOUNTER — Ambulatory Visit
Admission: RE | Admit: 2020-07-23 | Discharge: 2020-07-23 | Disposition: A | Discharge status: Home / Self Care | Payer: Medicare Other | Source: Ambulatory Visit | Attending: Obstetrics and Gynecology | Admitting: Obstetrics and Gynecology

## 2020-07-23 ENCOUNTER — Other Ambulatory Visit: Payer: Self-pay

## 2020-07-23 DIAGNOSIS — Z1231 Encounter for screening mammogram for malignant neoplasm of breast: Secondary | ICD-10-CM

## 2020-07-25 ENCOUNTER — Encounter: Payer: Self-pay | Admitting: Gastroenterology

## 2020-09-07 ENCOUNTER — Other Ambulatory Visit: Payer: Self-pay

## 2020-09-07 ENCOUNTER — Ambulatory Visit (INDEPENDENT_AMBULATORY_CARE_PROVIDER_SITE_OTHER): Payer: Medicare Other | Admitting: Gastroenterology

## 2020-09-07 ENCOUNTER — Encounter: Payer: Self-pay | Admitting: Gastroenterology

## 2020-09-07 VITALS — BP 120/70 | HR 75 | Ht 66.0 in | Wt 147.0 lb

## 2020-09-07 DIAGNOSIS — R1310 Dysphagia, unspecified: Secondary | ICD-10-CM

## 2020-09-07 DIAGNOSIS — Z86010 Personal history of colonic polyps: Secondary | ICD-10-CM

## 2020-09-07 NOTE — Progress Notes (Signed)
Referring Provider: Kerney Elbe, MD Primary Care Physician:  Kerney Elbe, MD  Reason for Consultation:  Pre-stomach problems   IMPRESSION:  Dysphagia to solids x years Post-prandial bowel movements and cramping    - evaluated and treated by [facility] History of colon polyp    - tubular adenoma on colonoscopy 2015    - surveillance recommended in 5 years Severe anxiety immediately following her last colonoscopy  PLAN: - EGD with biopsies to evaluate for reflux, EOE, ring, web, stricture, and malignancy - Surveillance colonoscopy - Unfortunately, limited strategies to prevent recurrent post-procedure experience, but we will work to provide a calm environment while she is at the [facility]  Please see the "Patient Instructions" section for addition details about the plan.  HPI: Rebecca Barber is a 70 y.o. female self-referred for "pre-stomach problems."   She has a longstanding history of postprandial bowel movements and cramping.  She has been working with Dr. Julien Nordmann at [facility].  Controls her symptoms by avoiding dairy, bread, and sugars.  Since switching to a diet for type a blood her symptoms have been better controlled.  She was diagnosed with a tapeworm and treated with therapy for 4 months.  The patient did not feel it was asymptomatic tapeworm as she has gained weight as opposed to losing weight during this time. In fact, she is very bothered by unintentional weight despite her strict diet.  Notes a longstanding history of dysphagia with solids x years. Associated choking with lodging at the sternal notched. Identified broccoli has a frequent offender. Improved with sips of water. Feels like the muscle aren't contracting. No odynophagia, sore throat, neck pain, dysphonia. She has not discussed these symptoms with [place].  She would like an upper endoscopy for further evaluation.  Prior endoscopic evaluation includes: Colonosocpy with Dr.  Olevia Perches  04/21/2002: normal Colonoscopy with Dr. Olevia Perches 07/19/14 revealed a tubular adenoma in the ascending colon.  Although she thinks she has had 3 colonoscopies, the last being in 2015.  Surveillance colonoscopy recommended in 2015.  Severe anxiety after her colonoscopy in 2015. She feels the bowel prep went into her lungs and caused her problems. Treated with albuterol nebulizer and deep breaths. Her husband had to be in the recovery room to calm her.   No complications after her colonoscopy in 2003.   No known family history of colon cancer or polyps. No family history of uterine/endometrial cancer, pancreatic cancer or gastric/stomach cancer.   Past Medical History:  Diagnosis Date  . Adenomatous colon polyp 07/2014  . Cyst in "bone marrow" in wrist  . Depression h/o in the 90's due to hypothyroid;also h/o pain as a result of thyroid not beingcontrolled for many years, per pt   pt denies. she said thyroid made her sleep and have pain.  Marland Kitchen GERD (gastroesophageal reflux disease)   . Hyperlipidemia   . Insomnia   . Postmenopausal   . S/P catheter ablation of slow pathway EP stud w/RF catheter ablation of accessory pathway with [facility]DrTaylor 5/01  . Thyroid disease hypothyroidism    Past Surgical History:  Procedure Laterality Date  . COLONOSCOPY    . ganglion cyst  left foot ,age 55  . heart ablation  2007   misfire and circulate   . injury of left meniscus  Dr Alvan Dame 02/2008   no surgery   . ROTATOR CUFF REPAIR  RIGHT,Dr.Norris    Current Outpatient Medications  Medication Sig Dispense Refill  . Ascorbic Acid (VITAMIN C) 100  MG tablet Take 100 mg by mouth daily.    Marland Kitchen BIOTIN PO Take 1 tablet by mouth daily.    . Calcium Carb-Cholecalciferol (CALCIUM + D3 PO) Take 500 mg by mouth daily.    Marland Kitchen estradiol (ESTRACE) 1 MG tablet Take 1 mg by mouth daily.    . Magnesium 500 MG TABS Take 500 mg by mouth daily.    . Multiple Vitamins-Minerals (ZINC PO) Take 1 capsule by mouth daily.    .  NON FORMULARY Testosterone gel , daily 3 weeks on and 1 week off    . NON FORMULARY [place] Cann[employer]    . OVER THE COUNTER MEDICATION Vit D , one daily    . PRESCRIPTION MEDICATION Nature thyroid, one daily 3/4 grain    . progesterone (PROMETRIUM) 200 MG capsule Take 200 mg by mouth at bedtime.     No current facility-administered medications for this visit.    Allergies as of 09/07/2020 - Review Complete 09/07/2020  Allergen Reaction Noted  . Celebrex [celecoxib] Itching, Swelling, and Other (See Comments) 01/16/2011    Family History  Problem Relation Age of Onset  . Stroke Mother   . Transient ischemic attack Mother   . Stroke Father        ?  Marland Kitchen Aneurysm Father        brain ?  Marland Kitchen Heart attack Brother   . Schizophrenia Brother   . Eating disorder Daughter   . Cancer Brother        parathyroid and agent orange  . Depression Brother   . Fibromyalgia Daughter   . Cancer Cousin        breast CA in her 57's  . Colon cancer Neg Hx   . Colon polyps Neg Hx     Social History   Socioeconomic History  . Marital status: Married    Spouse name: Not on file  . Number of children: 4  . Years of education: Not on file  . Highest education level: Not on file  Occupational History  . 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 Smoking status: Never Smoker  . Smokeless tobacco: Never Used  Vaping Use  . Vaping Use: Never used  Substance and Sexual Activity  . Alcohol use: Yes  . Drug use: No  . Sexual activity: Not on file  Other Topics Concern  . Not on file  Social History Narrative  . Not on file   Social Determinants of Health   Financial Resource Strain: Not on file  Food Insecurity: Not on file  Transportation Needs: Not on file  Physical Activity: Not on file  Stress: Not on file  Social Connections: Not on file  Intimate Partner Violence: Not on file    Review of Systems: 12 system ROS is negative except as noted above.   Physical  Exam: General:   Alert,  well-nourished, pleasant and cooperative in NAD Head:  Normocephalic and atraumatic. Eyes:  Sclera clear, no icterus.   Conjunctiva pink. Ears:  Normal auditory acuity. Nose:  No deformity, discharge,  or lesions. Mouth:  No deformity or lesions.   Neck:  Supple; no masses or thyromegaly. Lungs:  Clear throughout to auscultation.   No wheezes. Heart:  Regular rate and rhythm; no murmurs. Abdomen:  Soft,nontender, nondistended, normal bowel sounds, no rebound or guarding. No hepatosplenomegaly.   Rectal:  Deferred  Msk:  Symmetrical. No boney deformities LAD: No inguinal or umbilical LAD Extremities:  No clubbing or  edema. Neurologic:  Alert and  oriented x4;  grossly nonfocal Skin:  Intact without significant lesions or rashes. Psych:  Alert and cooperative. Normal mood and affect.   Erline Siddoway L. Tarri Glenn, MD, MPH 09/07/2020, 2:32 PM

## 2020-09-07 NOTE — Patient Instructions (Addendum)
COLONOSCOPY AND ENDOSCOPY: You have been scheduled for an endoscopy and colonoscopy. Please follow the written instructions given to you at your visit today.  PREP: Please pick up your prep supplies at the pharmacy within the next 1-3 days.  INHALERS: If you use inhalers (even only as needed), please bring them with you on the day of your procedure.  Thank you for trusting me with your gastrointestinal care!    Thornton Park, MD, MPH

## 2020-10-23 DIAGNOSIS — R87615 Unsatisfactory cytologic smear of cervix: Secondary | ICD-10-CM | POA: Diagnosis not present

## 2020-10-23 DIAGNOSIS — Z124 Encounter for screening for malignant neoplasm of cervix: Secondary | ICD-10-CM | POA: Diagnosis not present

## 2020-10-24 DIAGNOSIS — L578 Other skin changes due to chronic exposure to nonionizing radiation: Secondary | ICD-10-CM | POA: Diagnosis not present

## 2020-10-24 DIAGNOSIS — D2271 Melanocytic nevi of right lower limb, including hip: Secondary | ICD-10-CM | POA: Diagnosis not present

## 2020-10-24 DIAGNOSIS — L821 Other seborrheic keratosis: Secondary | ICD-10-CM | POA: Diagnosis not present

## 2020-10-24 DIAGNOSIS — D485 Neoplasm of uncertain behavior of skin: Secondary | ICD-10-CM | POA: Diagnosis not present

## 2020-10-24 DIAGNOSIS — D0462 Carcinoma in situ of skin of left upper limb, including shoulder: Secondary | ICD-10-CM | POA: Diagnosis not present

## 2020-10-24 DIAGNOSIS — L570 Actinic keratosis: Secondary | ICD-10-CM | POA: Diagnosis not present

## 2020-10-24 DIAGNOSIS — L708 Other acne: Secondary | ICD-10-CM | POA: Diagnosis not present

## 2020-10-29 ENCOUNTER — Telehealth: Payer: Self-pay | Admitting: Gastroenterology

## 2020-10-29 NOTE — Telephone Encounter (Signed)
Thank you for the follow-up. Please place on a cancellation list if she was hoping to reschedule at an earlier date. Thanks.

## 2020-10-29 NOTE — Telephone Encounter (Signed)
Hi Dr. Tarri Glenn, this patient just called to cancel procedure that was scheduled on 11/05/20 because she had a procedure few weeks ago and has not healed yet. Patient has rescheduled to 12/21/20 at 2:00pm. Thank you.

## 2020-11-05 ENCOUNTER — Encounter: Payer: Medicare Other | Admitting: Gastroenterology

## 2020-11-06 DIAGNOSIS — H2513 Age-related nuclear cataract, bilateral: Secondary | ICD-10-CM | POA: Diagnosis not present

## 2020-11-06 DIAGNOSIS — H524 Presbyopia: Secondary | ICD-10-CM | POA: Diagnosis not present

## 2020-11-06 DIAGNOSIS — H5213 Myopia, bilateral: Secondary | ICD-10-CM | POA: Diagnosis not present

## 2020-12-10 ENCOUNTER — Telehealth: Payer: Self-pay | Admitting: *Deleted

## 2020-12-10 NOTE — Telephone Encounter (Signed)
Patient no showed PV today- J Kooy RN Called patient and left message to return call by 5 pm today- If no call by 5 pm, PV and procedure will be canceled - no call at 5 pm -  PV and Procedure both canceled- No Show letter mailed to patient

## 2020-12-21 ENCOUNTER — Encounter: Payer: Medicare Other | Admitting: Gastroenterology

## 2020-12-24 ENCOUNTER — Encounter: Payer: Medicare Other | Admitting: Gastroenterology

## 2021-01-03 DIAGNOSIS — D0462 Carcinoma in situ of skin of left upper limb, including shoulder: Secondary | ICD-10-CM | POA: Diagnosis not present

## 2021-02-11 ENCOUNTER — Ambulatory Visit (AMBULATORY_SURGERY_CENTER): Payer: Medicare Other

## 2021-02-11 ENCOUNTER — Other Ambulatory Visit: Payer: Self-pay

## 2021-02-11 VITALS — Ht 66.0 in | Wt 136.0 lb

## 2021-02-11 DIAGNOSIS — R1310 Dysphagia, unspecified: Secondary | ICD-10-CM

## 2021-02-11 DIAGNOSIS — Z86010 Personal history of colonic polyps: Secondary | ICD-10-CM

## 2021-02-11 NOTE — Progress Notes (Signed)
Pt verified name, DOB, address and insurance during PV today.   Pt mailed instruction packet to include copy of consent form to read and not return, and instructions. PV completed over the phone. Pt encouraged to call with questions or issues.   No allergies to soy or egg Pt is not on blood thinners or diet pills Denies issues with sedation/intubation Denies atrial flutter/fib Denies constipation    Pt is aware of Covid safety and care partner requirements.     Pt declined to go over instructions during call stated that she can follow the instructions usually pretty well and that if she has questions she would call.  [facility] nurse noted that she would highlight the information to help.  Asked if she could use power aid  vs Gatorade noting it was the same thing.  It was noted if it was the same and she did no use red/purple colors  without pulp or sediment it should be fine.

## 2021-03-04 ENCOUNTER — Ambulatory Visit (AMBULATORY_SURGERY_CENTER): Payer: Medicare Other | Admitting: Gastroenterology

## 2021-03-04 ENCOUNTER — Other Ambulatory Visit: Payer: Self-pay

## 2021-03-04 ENCOUNTER — Encounter: Payer: Self-pay | Admitting: Gastroenterology

## 2021-03-04 VITALS — BP 128/59 | HR 73 | Temp 98.4°F | Resp 17 | Ht 66.0 in | Wt 136.0 lb

## 2021-03-04 DIAGNOSIS — R1310 Dysphagia, unspecified: Secondary | ICD-10-CM

## 2021-03-04 DIAGNOSIS — D122 Benign neoplasm of ascending colon: Secondary | ICD-10-CM

## 2021-03-04 DIAGNOSIS — K2950 Unspecified chronic gastritis without bleeding: Secondary | ICD-10-CM | POA: Diagnosis not present

## 2021-03-04 DIAGNOSIS — R1084 Generalized abdominal pain: Secondary | ICD-10-CM | POA: Diagnosis not present

## 2021-03-04 DIAGNOSIS — K2100 Gastro-esophageal reflux disease with esophagitis, without bleeding: Secondary | ICD-10-CM | POA: Diagnosis not present

## 2021-03-04 DIAGNOSIS — K2289 Other specified disease of esophagus: Secondary | ICD-10-CM | POA: Diagnosis not present

## 2021-03-04 DIAGNOSIS — Z86010 Personal history of colonic polyps: Secondary | ICD-10-CM | POA: Diagnosis not present

## 2021-03-04 DIAGNOSIS — K449 Diaphragmatic hernia without obstruction or gangrene: Secondary | ICD-10-CM | POA: Diagnosis not present

## 2021-03-04 DIAGNOSIS — K200 Eosinophilic esophagitis: Secondary | ICD-10-CM | POA: Diagnosis not present

## 2021-03-04 DIAGNOSIS — K3189 Other diseases of stomach and duodenum: Secondary | ICD-10-CM | POA: Diagnosis not present

## 2021-03-04 MED ORDER — SODIUM CHLORIDE 0.9 % IV SOLN: 500.0000 mL | INTRAVENOUS | Status: DC

## 2021-03-04 MED ORDER — OMEPRAZOLE 40 MG PO CPDR: 40.0000 mg | DELAYED_RELEASE_CAPSULE | Freq: Every morning | ORAL | 3 refills | Status: DC

## 2021-03-04 NOTE — Op Note (Addendum)
[facility] Patient Name: Rebecca Barber Procedure Date: 03/04/2021 2:26 PM MRN: [id number] Endoscopist: Thornton Park MD, MD Age: 70 Referring MD:  Date of Birth: [birth year] Gender: Female Account #: [id number] Procedure:                Upper GI endoscopy Indications:              Dysphagia to solids x years                           Post-prandial bowel movements and cramping Medicines:                Monitored Anesthesia Care Procedure:                Pre-Anesthesia Assessment:                           - Prior to the procedure, a History and Physical                            was performed, and patient medications and                            allergies were reviewed. The patient's tolerance of                            previous anesthesia was also reviewed. The risks                            and benefits of the procedure and the sedation                            options and risks were discussed with the patient.                            All questions were answered, and informed consent                            was obtained. Prior Anticoagulants: The patient has                            taken no previous anticoagulant or antiplatelet                            agents. ASA Grade Assessment: II - A patient with                            mild systemic disease. After reviewing the risks                            and benefits, the patient was deemed in                            satisfactory condition to undergo the procedure.  After obtaining informed consent, the endoscope was                            passed under direct vision. Throughout the                            procedure, the patient's blood pressure, pulse, and                            oxygen saturations were monitored continuously. The                            GIF HQ190 #[id number] was introduced through the                            mouth, and advanced to the third part  of duodenum.                            The upper GI endoscopy was accomplished without                            difficulty. The patient tolerated the procedure                            well. Findings:                 LA Grade A (one or more mucosal breaks less than 5                            mm, not extending between tops of 2 mucosal folds)                            esophagitis with no bleeding was found. The z-line                            is located 36cm from the incisors. Biopsies were                            taken with a cold forceps for histology. Estimated                            blood loss was minimal.                           Diffuse minimal inflammation characterized by                            erythema, friability and granularity was found in                            the gastric body. Biopsies were taken from the  antrum, body, and fundus with a cold forceps for                            histology. Estimated blood loss was minimal.                           A medium-sized hiatal hernia was present.                           The examined duodenum was normal. Biopsies were                            taken with a cold forceps for histology. Estimated                            blood loss was minimal.                           The exam was otherwise without abnormality. Complications:            No immediate complications. Estimated blood loss:                            Minimal. Estimated Blood Loss:     Estimated blood loss was minimal. Impression:               - LA Grade A reflux esophagitis with no bleeding.                            Biopsied.                           - Gastritis. Biopsied.                           - Medium-sized hiatal hernia.                           - Normal examined duodenum. Biopsied.                           - The examination was otherwise normal. Recommendation:           - Patient has a contact  number available for                            emergencies. The signs and symptoms of potential                            delayed complications were discussed with the                            patient. Return to normal activities tomorrow.                            Written discharge instructions were provided to the  patient.                           - Resume previous diet.                           - Continue present medications.                           - Avoid all NSAIDs.                           - Start omeprazole 40 mg QAM.                           - Await pathology results. Consider esophageal                            manometry if biopsies are normal. Thornton Park MD, MD 03/04/2021 2:48:39 PM This report has been signed electronically.

## 2021-03-04 NOTE — Patient Instructions (Signed)
Handouts given : esophagitis, hiatal hernia Resume previous diet Continue current medications Start omeprazole 40mg  every morning Avoid all NSAID's  YOU HAD AN ENDOSCOPIC PROCEDURE TODAY AT [facility]:   Refer to the procedure report that was given to you for any specific questions about what was found during the examination.  If the procedure report does not answer your questions, please call your gastroenterologist to clarify.  If you requested that your care partner not be given the details of your procedure findings, then the procedure report has been included in a sealed envelope for you to review at your convenience later.  YOU SHOULD EXPECT: Some feelings of bloating in the abdomen. Passage of more gas than usual.  Walking can help get rid of the air that was put into your GI tract during the procedure and reduce the bloating. If you had a lower endoscopy (such as a colonoscopy or flexible sigmoidoscopy) you may notice spotting of blood in your stool or on the toilet paper. If you underwent a bowel prep for your procedure, you may not have a normal bowel movement for a few days.  Please Note:  You might notice some irritation and congestion in your nose or some drainage.  This is from the oxygen used during your procedure.  There is no need for concern and it should clear up in a day or so.  SYMPTOMS TO REPORT IMMEDIATELY:  Following lower endoscopy (colonoscopy or flexible sigmoidoscopy):  Excessive amounts of blood in the stool  Significant tenderness or worsening of abdominal pains  Swelling of the abdomen that is new, acute  Fever of 100F or higher  Following upper endoscopy (EGD)  Vomiting of blood or coffee ground material  New chest pain or pain under the shoulder blades  Painful or persistently difficult swallowing  New shortness of breath  Fever of 100F or higher  Black, tarry-looking stools  For urgent or emergent issues, a gastroenterologist can be  reached at any hour by calling [phone]. Do not use MyChart messaging for urgent concerns.   DIET:  We do recommend a small meal at first, but then you may proceed to your regular diet.  Drink plenty of fluids but you should avoid alcoholic beverages for 24 hours.  ACTIVITY:  You should plan to take it easy for the rest of today and you should NOT DRIVE or use heavy machinery until tomorrow (because of the sedation medicines used during the test).    FOLLOW UP: Our staff will call the number listed on your records 48-72 hours following your procedure to check on you and address any questions or concerns that you may have regarding the information given to you following your procedure. If we do not reach you, we will leave a message.  We will attempt to reach you two times.  During this call, we will ask if you have developed any symptoms of COVID 19. If you develop any symptoms (ie: fever, flu-like symptoms, shortness of breath, cough etc.) before then, please call [phone].  If you test positive for Covid 19 in the 2 weeks post procedure, please call and report this information to [place].    If any biopsies were taken you will be contacted by phone or by letter within the next 1-3 weeks.  Please call us at [phone] if you have not heard about the biopsies in 3 weeks.   SIGNATURES/CONFIDENTIALITY: You and/or your care partner have signed paperwork which will be entered into  your electronic medical record.  These signatures attest to the fact that that the information above on your After Visit Summary has been reviewed and is understood.  Full responsibility of the confidentiality of this discharge information lies with you and/or your care-partner.

## 2021-03-04 NOTE — Progress Notes (Signed)
To PACU, VSS. Report to [email or web address] 

## 2021-03-04 NOTE — Progress Notes (Signed)
Pt's states no medical or surgical changes since previsit or office visit. 

## 2021-03-04 NOTE — Progress Notes (Signed)
Called to room to assist during endoscopic procedure.  Patient ID and intended procedure confirmed with present staff. Received instructions for my participation in the procedure from the performing physician.  

## 2021-03-04 NOTE — Op Note (Signed)
[facility] Patient Name: Kili Gracy Procedure Date: 03/04/2021 2:18 PM MRN: [id number] Endoscopist: Thornton Park MD, MD Age: 70 Referring MD:  Date of Birth: 03/03/1951 Gender: Female Account #: [id number] Procedure:                Colonoscopy Indications:              Surveillance: Personal history of adenomatous                            polyps on last colonoscopy > 5 years ago                           Post-prandial bowel movements and cramping                           - evaluated and treated by [facility]                           History of colon polyp                           - tubular adenoma on colonoscopy 2015                           - surveillance recommended in 5 years Medicines:                Monitored Anesthesia Care Procedure:                Pre-Anesthesia Assessment:                           - Prior to the procedure, a History and Physical                            was performed, and patient medications and                            allergies were reviewed. The patient's tolerance of                            previous anesthesia was also reviewed. The risks                            and benefits of the procedure and the sedation                            options and risks were discussed with the patient.                            All questions were answered, and informed consent                            was obtained. Prior Anticoagulants: The patient has  taken no previous anticoagulant or antiplatelet                            agents. ASA Grade Assessment: II - A patient with                            mild systemic disease. After reviewing the risks                            and benefits, the patient was deemed in                            satisfactory condition to undergo the procedure.                           After obtaining informed consent, the colonoscope                            was  passed under direct vision. Throughout the                            procedure, the patient's blood pressure, pulse, and                            oxygen saturations were monitored continuously. The                            Olympus CF-HQ190L ([id number]) Colonoscope was                            introduced through the anus and advanced to the 3                            cm into the ileum. A second forward view of the                            right colon was performed. The colonoscopy was                            performed without difficulty. The patient tolerated                            the procedure well. The quality of the bowel                            preparation was good. The terminal ileum, ileocecal                            valve, appendiceal orifice, and rectum were                            photographed. Scope In: 2:50:38 PM Scope Out: 3:05:16 PM Scope Withdrawal Time: 0 hours 10 minutes 19 seconds  Total Procedure Duration:  0 hours 14 minutes 38 seconds  Findings:                 The perianal and digital rectal examinations were                            normal.                           A 2 mm polyp was found in the ascending colon. The                            polyp was sessile. The polyp was removed with a                            cold snare. Resection and retrieval were complete.                            Estimated blood loss was minimal.                           The entired examined colonic mucosa otherwise                            appeared normal. Biopsies for histology were taken                            with a cold forceps from the right colon and left                            colon for evaluation of microscopic colitis.                            Estimated blood loss was minimal.                           The exam was otherwise without abnormality on                            direct and retroflexion views including distal                             views of the terminal ileum. Complications:            No immediate complications. Estimated blood loss:                            Minimal. Estimated Blood Loss:     Estimated blood loss was minimal. Impression:               - One 2 mm polyp in the ascending colon, removed                            with a cold snare. Resected and retrieved.                           -  The entire examined colon is normal. Biopsied.                           - The examination was otherwise normal on direct                            and retroflexion views. Recommendation:           - Patient has a contact number available for                            emergencies. The signs and symptoms of potential                            delayed complications were discussed with the                            patient. Return to normal activities tomorrow.                            Written discharge instructions were provided to the                            patient.                           - Resume previous diet.                           - Continue present medications.                           - Await pathology results.                           - Repeat colonoscopy date to be determined after                            pending pathology results are reviewed for                            surveillance.                           - Emerging evidence supports eating a diet of                            fruits, vegetables, grains, calcium, and yogurt                            while reducing red meat and alcohol may reduce the                            risk of colon cancer.                           - Thank you  for allowing me to be involved in your                            colon cancer prevention. Thornton Park MD, MD 03/04/2021 3:13:25 PM This report has been signed electronically.

## 2021-03-06 ENCOUNTER — Telehealth: Payer: Self-pay | Admitting: *Deleted

## 2021-03-06 ENCOUNTER — Telehealth: Payer: Self-pay

## 2021-03-06 NOTE — Telephone Encounter (Signed)
Called [phone] and left a message we tried to reach pt for a follow up call. maw

## 2021-03-06 NOTE — Telephone Encounter (Signed)
  Follow up Call-  Call back number 03/04/2021  Post procedure Call Back phone  # [phone]  Permission to leave phone message Yes  Some recent data might be hidden    LMOM to call back with any questions or concerns.  Also, call back if patient has developed fever, respiratory issues or been dx with COVID or had any family members or close contacts diagnosed since her procedure.

## 2021-03-11 ENCOUNTER — Encounter: Payer: Self-pay | Admitting: Gastroenterology

## 2021-06-20 DIAGNOSIS — Z23 Encounter for immunization: Secondary | ICD-10-CM | POA: Diagnosis not present

## 2021-06-20 DIAGNOSIS — L570 Actinic keratosis: Secondary | ICD-10-CM | POA: Diagnosis not present

## 2021-11-12 DIAGNOSIS — H2513 Age-related nuclear cataract, bilateral: Secondary | ICD-10-CM | POA: Diagnosis not present

## 2021-12-05 DIAGNOSIS — D2271 Melanocytic nevi of right lower limb, including hip: Secondary | ICD-10-CM | POA: Diagnosis not present

## 2021-12-05 DIAGNOSIS — L821 Other seborrheic keratosis: Secondary | ICD-10-CM | POA: Diagnosis not present

## 2021-12-05 DIAGNOSIS — L723 Sebaceous cyst: Secondary | ICD-10-CM | POA: Diagnosis not present

## 2021-12-05 DIAGNOSIS — D485 Neoplasm of uncertain behavior of skin: Secondary | ICD-10-CM | POA: Diagnosis not present

## 2021-12-05 DIAGNOSIS — L578 Other skin changes due to chronic exposure to nonionizing radiation: Secondary | ICD-10-CM | POA: Diagnosis not present

## 2021-12-05 DIAGNOSIS — L570 Actinic keratosis: Secondary | ICD-10-CM | POA: Diagnosis not present

## 2021-12-30 DIAGNOSIS — L989 Disorder of the skin and subcutaneous tissue, unspecified: Secondary | ICD-10-CM | POA: Diagnosis not present

## 2022-01-29 DIAGNOSIS — L570 Actinic keratosis: Secondary | ICD-10-CM | POA: Diagnosis not present

## 2022-06-18 DIAGNOSIS — D2271 Melanocytic nevi of right lower limb, including hip: Secondary | ICD-10-CM | POA: Diagnosis not present

## 2022-06-18 DIAGNOSIS — L578 Other skin changes due to chronic exposure to nonionizing radiation: Secondary | ICD-10-CM | POA: Diagnosis not present

## 2022-06-18 DIAGNOSIS — L910 Hypertrophic scar: Secondary | ICD-10-CM | POA: Diagnosis not present

## 2022-06-18 DIAGNOSIS — L723 Sebaceous cyst: Secondary | ICD-10-CM | POA: Diagnosis not present

## 2022-06-18 DIAGNOSIS — L821 Other seborrheic keratosis: Secondary | ICD-10-CM | POA: Diagnosis not present

## 2022-06-18 DIAGNOSIS — L570 Actinic keratosis: Secondary | ICD-10-CM | POA: Diagnosis not present

## 2022-08-05 ENCOUNTER — Other Ambulatory Visit: Payer: Self-pay

## 2022-08-05 ENCOUNTER — Encounter (HOSPITAL_BASED_OUTPATIENT_CLINIC_OR_DEPARTMENT_OTHER): Payer: Self-pay

## 2022-08-05 ENCOUNTER — Emergency Department (HOSPITAL_BASED_OUTPATIENT_CLINIC_OR_DEPARTMENT_OTHER)
Admission: EM | Admit: 2022-08-05 | Discharge: 2022-08-05 | Disposition: A | Discharge status: Home / Self Care | Payer: Medicare Other | Attending: Emergency Medicine | Admitting: Emergency Medicine

## 2022-08-05 DIAGNOSIS — S6992XA Unspecified injury of left wrist, hand and finger(s), initial encounter: Secondary | ICD-10-CM | POA: Diagnosis present

## 2022-08-05 DIAGNOSIS — W268XXA Contact with other sharp object(s), not elsewhere classified, initial encounter: Secondary | ICD-10-CM | POA: Diagnosis not present

## 2022-08-05 DIAGNOSIS — S61213A Laceration without foreign body of left middle finger without damage to nail, initial encounter: Secondary | ICD-10-CM | POA: Diagnosis not present

## 2022-08-05 DIAGNOSIS — Z23 Encounter for immunization: Secondary | ICD-10-CM | POA: Diagnosis not present

## 2022-08-05 MED ORDER — TETANUS-DIPHTH-ACELL PERTUSSIS 5-2.5-18.5 LF-MCG/0.5 IM SUSY
0.5000 mL | PREFILLED_SYRINGE | Freq: Once | INTRAMUSCULAR | Status: AC
  Administered 2022-08-05: 0.5 mL via INTRAMUSCULAR
  Filled 2022-08-05: qty 0.5

## 2022-08-05 NOTE — Discharge Instructions (Signed)
You were seen in the ER for a laceration to your left finger. As the wound was minimal with minimal bleeding, a combination of steri-strips and dermabond was used to repair this wound. Try to avoid water exposure to the area but you can wash with this in place.

## 2022-08-05 NOTE — ED Notes (Signed)
RN provided AVS using Teachback Method. Patient verbalizes understanding of Discharge Instructions. Opportunity for Questioning and Answers were provided by RN. Patient Discharged from ED ambulatory to Home with Family. ? ?

## 2022-08-05 NOTE — ED Provider Notes (Signed)
[facility] EMERGENCY DEPT Provider Note   CSN: [id number] Arrival date & time: 08/05/22  1646     History  Chief Complaint  Patient presents with   Laceration    Rebecca Barber is a 71 y.o. female.   Laceration Patient presents to the emergency department after a laceration to the left third digit.  She reports that she was using a tape measure earlier today when began to retract and cut into her finger.  Patient states that she was able to control the bleeding at home with adequate pressure.  No loss of sensation or mobility and affected finger.  Denies any injuries to any other fingers or hands.    Home Medications Prior to Admission medications   Medication Sig Start Date End Date Taking? Authorizing Provider  Ascorbic Acid (VITAMIN C) 100 MG tablet Take 100 mg by mouth daily.    [provider]  BIOTIN PO Take 1 tablet by mouth daily.    [provider]  Calcium Carb-Cholecalciferol (CALCIUM + D3 PO) Take 500 mg by mouth daily.    [provider]  estradiol (ESTRACE) 1 MG tablet Take 1 mg by mouth daily. 06/14/20   [provider]  Magnesium 500 MG TABS Take 500 mg by mouth daily.    [provider]  Multiple Vitamins-Minerals (ZINC PO) Take 1 capsule by mouth daily.    [provider]  NALTREXONE HCL PO Take by mouth.    [provider]  NON FORMULARY Testosterone gel , daily 3 weeks on and 1 week off    [provider]  NON FORMULARY [employer]    [provider]  omeprazole (PRILOSEC) 40 MG capsule Take 1 capsule (40 mg total) by mouth every morning. 03/04/21   Thornton Park, MD  ondansetron (ZOFRAN-ODT) 8 MG disintegrating tablet Take 8 mg by mouth every 8 (eight) hours as needed. Patient not taking: No sig reported 09/14/20   [provider]  OVER THE COUNTER MEDICATION Vit D , one daily    [provider]  PRESCRIPTION MEDICATION Nature  thyroid, one daily 3/4 grain    [provider]  progesterone (PROMETRIUM) 200 MG capsule Take 200 mg by mouth at bedtime. 08/02/20   [provider]      Allergies    Celebrex [celecoxib]    Review of Systems   Review of Systems  Skin:  Positive for wound.  All other systems reviewed and are negative.   Physical Exam Updated Vital Signs BP (!) 137/94   Pulse 76   Temp 97.9 F (36.6 C) (Temporal)   Resp 16   Ht '5\' 6"'$  (1.676 m)   Wt 61.7 kg   SpO2 96%   BMI 21.95 kg/m  Physical Exam Vitals and nursing note reviewed.  Constitutional:      Appearance: Normal appearance. She is normal weight.  HENT:     Head: Normocephalic and atraumatic.  Cardiovascular:     Rate and Rhythm: Normal rate and regular rhythm.  Pulmonary:     Effort: Pulmonary effort is normal.     Breath sounds: Normal breath sounds.  Skin:    General: Skin is warm and dry.     Findings: Lesion present.     Comments: 2cm laceration to left middle finger on dorsal side  Neurological:     Mental Status: She is alert.     ED Results / Procedures / Treatments   Labs (all labs ordered are  listed, but only abnormal results are displayed) Labs Reviewed - No data to display  EKG None  Radiology No results found.  Procedures .Marland KitchenLaceration Repair  Date/Time: 08/05/2022 8:52 PM  Performed by: Luvenia Heller, PA-C Authorized by: Luvenia Heller, PA-C   Consent:    Consent obtained:  Verbal   Consent given by:  Patient   Risks, benefits, and alternatives were discussed: yes     Risks discussed:  Infection and pain   Alternatives discussed:  No treatment Universal protocol:    Patient identity confirmed:  Verbally with patient Anesthesia:    Anesthesia method:  None Laceration details:    Location:  Finger   Finger location:  L long finger   Length (cm):  2   Depth (mm):  0.2 Pre-procedure details:    Preparation:  Patient was prepped and draped in usual sterile  fashion Exploration:    Limited defect created (wound extended): yes     Hemostasis achieved with:  Direct pressure   Wound extent: areolar tissue not violated, fascia not violated, no foreign body, no signs of injury, no nerve damage, no tendon damage, no underlying fracture and no vascular damage     Contaminated: no   Treatment:    Area cleansed with:  Povidone-iodine   Amount of cleaning:  Standard   Irrigation solution:  Sterile saline   Irrigation method:  Pressure wash   Visualized foreign bodies/material removed: no     Debridement:  Minimal   Undermining:  None   Scar revision: no   Skin repair:    Repair method:  Steri-Strips and tissue adhesive   Number of Steri-Strips:  2 Approximation:    Approximation:  Close Repair type:    Repair type:  Simple Post-procedure details:    Dressing:  Non-adherent dressing   Procedure completion:  Tolerated    Medications Ordered in ED Medications  Tdap (BOOSTRIX) injection 0.5 mL (0.5 mLs Intramuscular Given 08/05/22 2038)    ED Course/ Medical Decision Making/ A&P                           Medical Decision Making  This patient presents to the ED for concern of finger laceration.  Differential diagnosis includes deep laceration causing injury to tendon, crush injury, finger dislocation, nail avulsion, finger fracture.   Medicines ordered and prescription drug management:  I ordered medication including tdap  for skin laceration  I have reviewed the patients home medicines and have made adjustments as needed   Problem List / ED Course:  Patient presented to the ER following a laceration to third digit of left hand. Patient had achieved hemostasis prior to arrival. Laceration was simple with minimal depth, but repaired using steristrips and dermabond. Advised limited use of hand for next week to reduce risk of reopening laceration. Patient was agreeable to this plan and verbalized understanding.  Final Clinical  Impression(s) / ED Diagnoses Final diagnoses:  Laceration of left middle finger without foreign body without damage to nail, initial encounter    Rx / DC Orders ED Discharge Orders     None         Luvenia Heller, PA-C 08/05/22 2058    Kemper Durie, DO 08/06/22 0007

## 2022-08-05 NOTE — ED Triage Notes (Signed)
Patient here POV from Home.  Endorses using a Tape Measurer today at 1500 when the recoil caused a laceration. 1-2 cm Laceration to Posterior Distal Third Left Digit.   Tetanus is Out of Date.   NAD Noted during Triage. A&Ox4. GCS 15. Ambulatory.

## 2022-11-02 IMAGING — MG DIGITAL SCREENING BILAT W/ TOMO W/ CAD
8 series · 8 of 24 positions shown · non-contrast
Comparison: Previous exam(s).

CLINICAL DATA: Screening.

EXAM:
DIGITAL SCREENING BILATERAL MAMMOGRAM WITH TOMO AND CAD

[L MLO synth-2D]
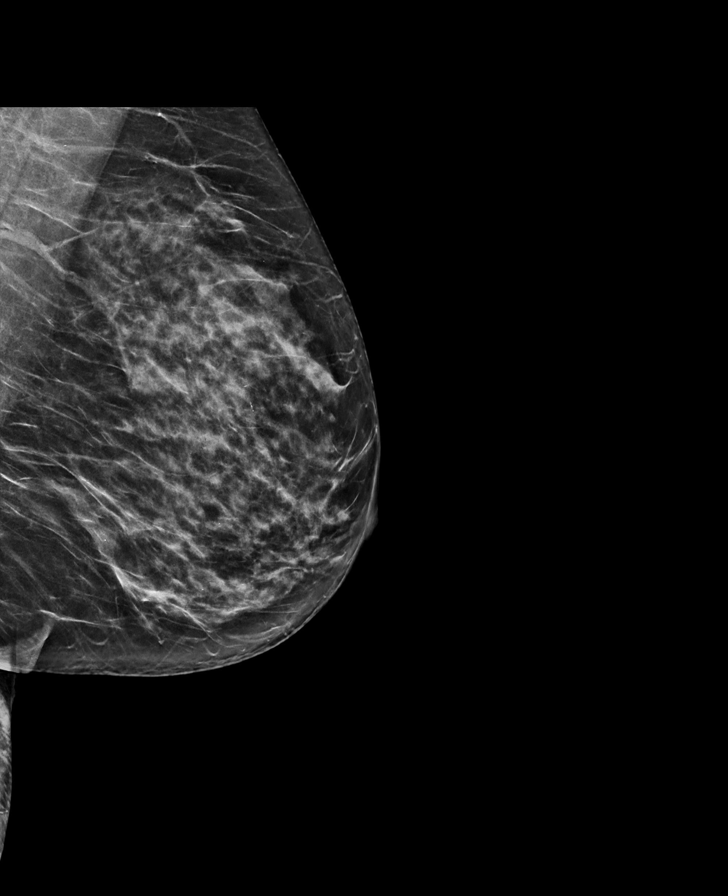

[R CC synth-2D]
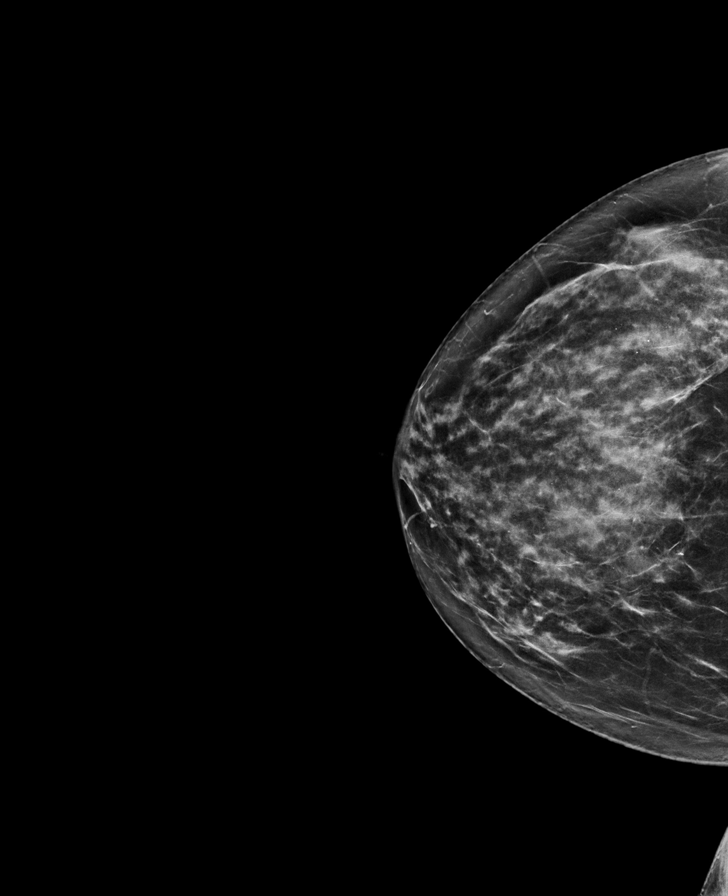

[L CC synth-2D]
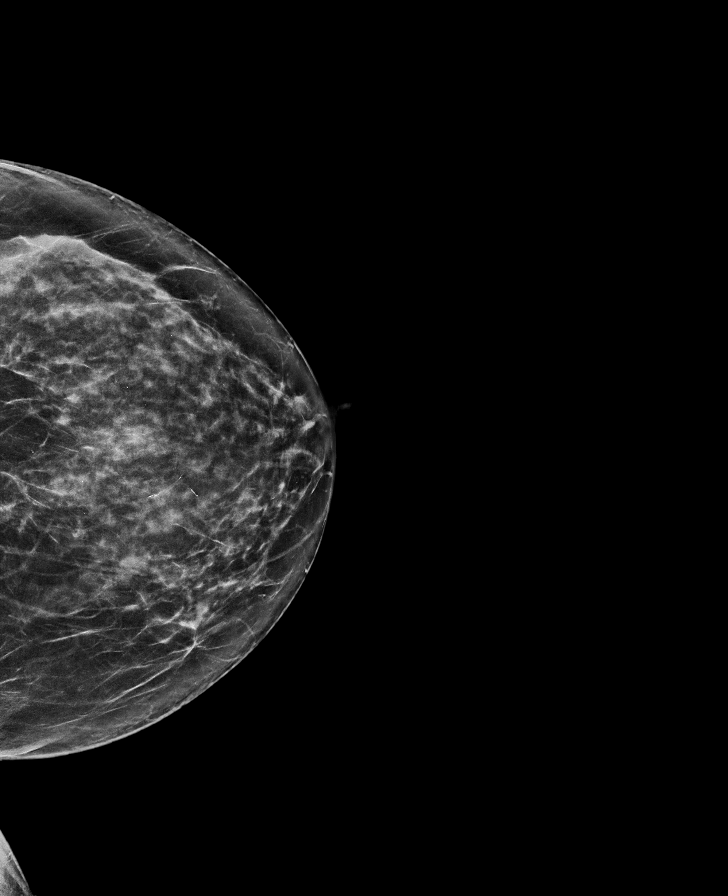

[R MLO synth-2D]
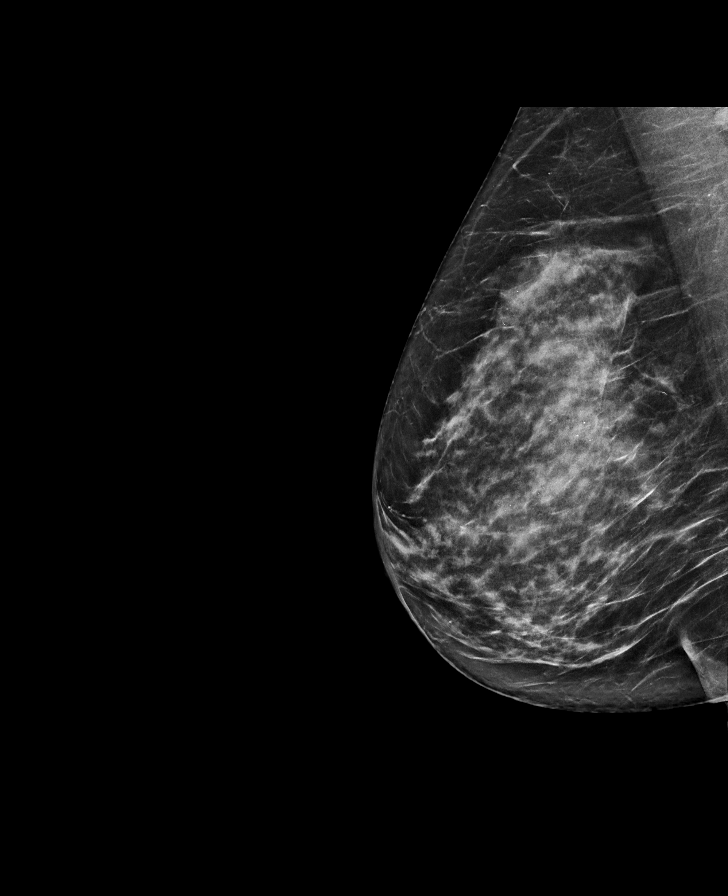

[L MLO tomo · tomo slice 35/70.0]
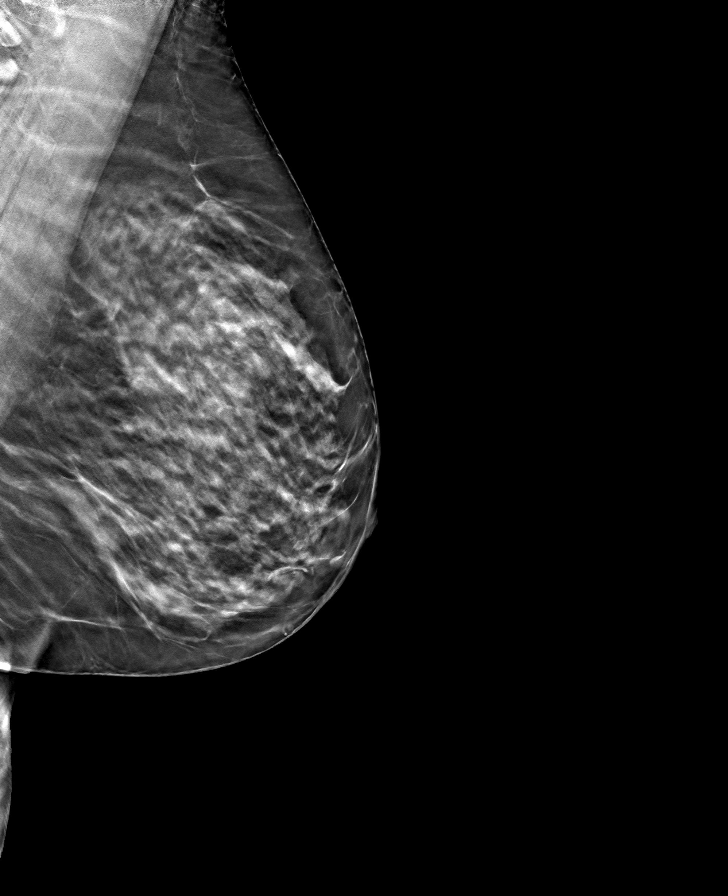

[R CC tomo · tomo slice 35/69.0]
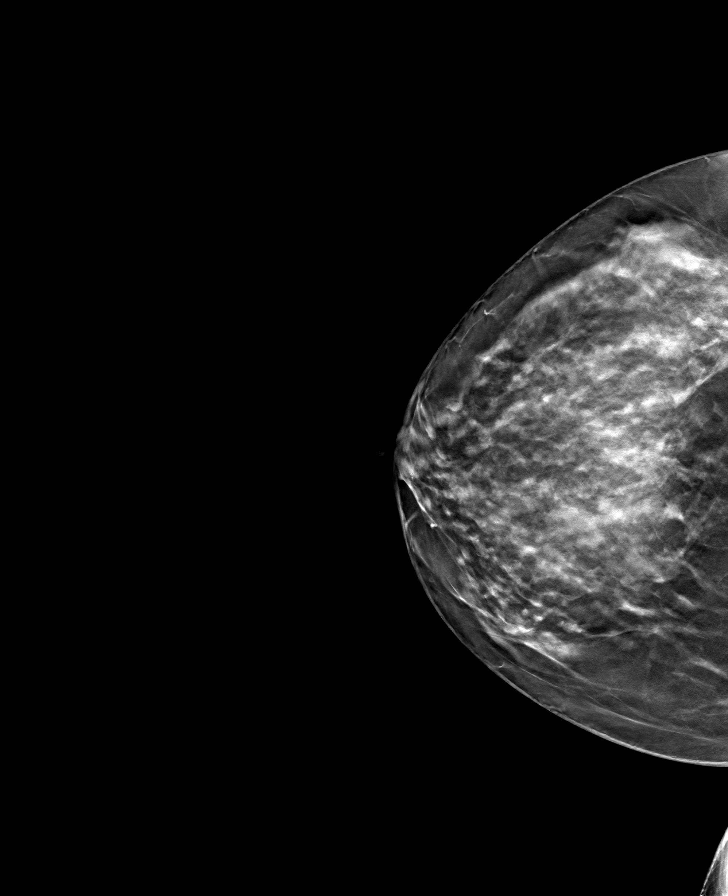

[R MLO tomo · tomo slice 37/72.0]
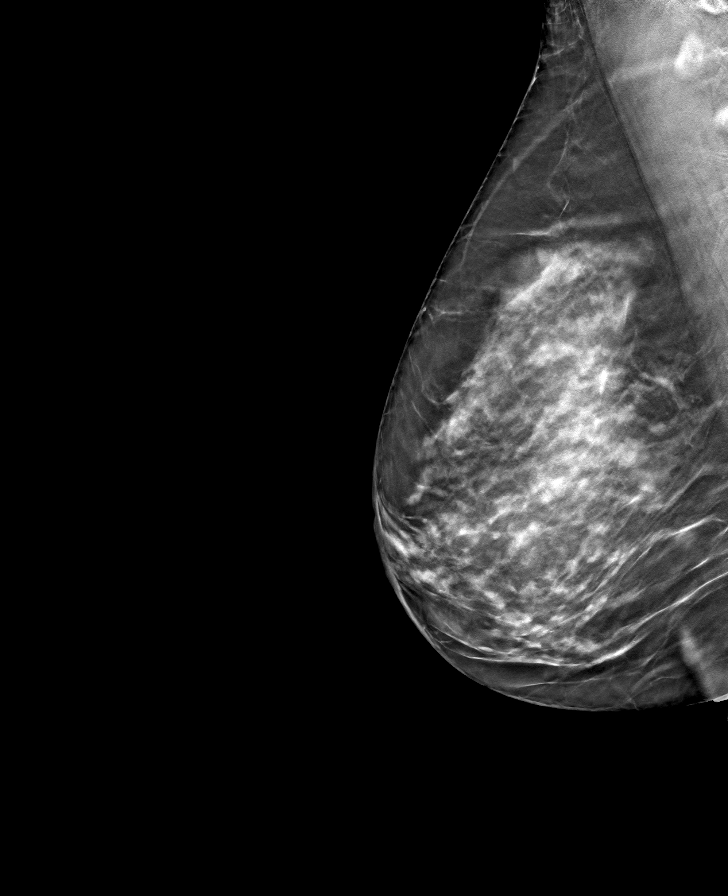

[L CC tomo · tomo slice 35/69.0]
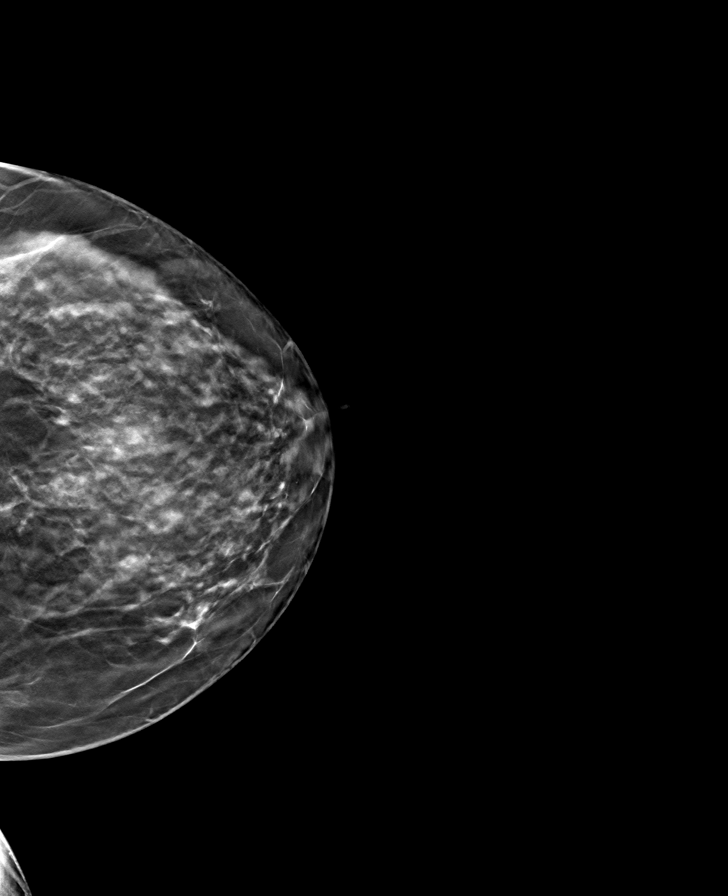

[8 of 24 positions shown; findings below may reference images not displayed]

ACR Breast Density Category c: The breast tissue is heterogeneously
dense, which may obscure small masses.
FINDINGS: There are no findings suspicious for malignancy. Images were
processed with CAD.
IMPRESSION: No mammographic evidence of malignancy. A result letter of this
screening mammogram will be mailed directly to the patient.

RECOMMENDATION:
Screening mammogram in one year. (Code:FT-U-LHB)

BI-RADS CATEGORY  1: Negative.

## 2022-11-26 DIAGNOSIS — J209 Acute bronchitis, unspecified: Secondary | ICD-10-CM | POA: Diagnosis not present

## 2022-11-26 DIAGNOSIS — R059 Cough, unspecified: Secondary | ICD-10-CM | POA: Diagnosis not present

## 2022-11-26 DIAGNOSIS — J984 Other disorders of lung: Secondary | ICD-10-CM | POA: Diagnosis not present

## 2022-12-01 DIAGNOSIS — R9389 Abnormal findings on diagnostic imaging of other specified body structures: Secondary | ICD-10-CM | POA: Diagnosis not present

## 2022-12-01 DIAGNOSIS — K449 Diaphragmatic hernia without obstruction or gangrene: Secondary | ICD-10-CM | POA: Diagnosis not present

## 2022-12-01 DIAGNOSIS — J8410 Pulmonary fibrosis, unspecified: Secondary | ICD-10-CM | POA: Diagnosis not present

## 2022-12-01 DIAGNOSIS — J9811 Atelectasis: Secondary | ICD-10-CM | POA: Diagnosis not present

## 2022-12-01 DIAGNOSIS — R918 Other nonspecific abnormal finding of lung field: Secondary | ICD-10-CM | POA: Diagnosis not present

## 2023-01-07 ENCOUNTER — Other Ambulatory Visit: Payer: Self-pay | Admitting: Obstetrics and Gynecology

## 2023-01-07 DIAGNOSIS — Z1231 Encounter for screening mammogram for malignant neoplasm of breast: Secondary | ICD-10-CM

## 2023-01-22 DIAGNOSIS — H25813 Combined forms of age-related cataract, bilateral: Secondary | ICD-10-CM | POA: Diagnosis not present

## 2023-02-03 ENCOUNTER — Ambulatory Visit
Admission: RE | Admit: 2023-02-03 | Discharge: 2023-02-03 | Disposition: A | Discharge status: Home / Self Care | Payer: Medicare Other | Source: Ambulatory Visit | Attending: Obstetrics and Gynecology | Admitting: Obstetrics and Gynecology

## 2023-02-03 DIAGNOSIS — Z1231 Encounter for screening mammogram for malignant neoplasm of breast: Secondary | ICD-10-CM | POA: Diagnosis not present

## 2023-02-18 DIAGNOSIS — L723 Sebaceous cyst: Secondary | ICD-10-CM | POA: Diagnosis not present

## 2023-02-18 DIAGNOSIS — L089 Local infection of the skin and subcutaneous tissue, unspecified: Secondary | ICD-10-CM | POA: Diagnosis not present

## 2023-02-18 DIAGNOSIS — D485 Neoplasm of uncertain behavior of skin: Secondary | ICD-10-CM | POA: Diagnosis not present

## 2023-02-18 DIAGNOSIS — L820 Inflamed seborrheic keratosis: Secondary | ICD-10-CM | POA: Diagnosis not present

## 2023-02-18 DIAGNOSIS — B079 Viral wart, unspecified: Secondary | ICD-10-CM | POA: Diagnosis not present

## 2023-03-10 DIAGNOSIS — Z780 Asymptomatic menopausal state: Secondary | ICD-10-CM | POA: Diagnosis not present

## 2023-03-10 DIAGNOSIS — E039 Hypothyroidism, unspecified: Secondary | ICD-10-CM | POA: Diagnosis not present

## 2023-03-10 DIAGNOSIS — Z01419 Encounter for gynecological examination (general) (routine) without abnormal findings: Secondary | ICD-10-CM | POA: Diagnosis not present

## 2023-03-10 DIAGNOSIS — N952 Postmenopausal atrophic vaginitis: Secondary | ICD-10-CM | POA: Diagnosis not present

## 2023-04-14 ENCOUNTER — Ambulatory Visit (INDEPENDENT_AMBULATORY_CARE_PROVIDER_SITE_OTHER): Payer: Medicare Other | Admitting: Internal Medicine

## 2023-04-14 ENCOUNTER — Ambulatory Visit: Admission: RE | Admit: 2023-04-14 | Payer: Medicare Other | Source: Ambulatory Visit

## 2023-04-14 ENCOUNTER — Encounter: Payer: Self-pay | Admitting: Internal Medicine

## 2023-04-14 ENCOUNTER — Other Ambulatory Visit (INDEPENDENT_AMBULATORY_CARE_PROVIDER_SITE_OTHER): Payer: Medicare Other

## 2023-04-14 VITALS — BP 126/80 | HR 81 | Ht 66.0 in | Wt 152.0 lb

## 2023-04-14 DIAGNOSIS — R197 Diarrhea, unspecified: Secondary | ICD-10-CM | POA: Diagnosis not present

## 2023-04-14 DIAGNOSIS — K449 Diaphragmatic hernia without obstruction or gangrene: Secondary | ICD-10-CM | POA: Diagnosis not present

## 2023-04-14 DIAGNOSIS — Z860100 Personal history of colon polyps, unspecified: Secondary | ICD-10-CM

## 2023-04-14 DIAGNOSIS — R1310 Dysphagia, unspecified: Secondary | ICD-10-CM

## 2023-04-14 DIAGNOSIS — R1084 Generalized abdominal pain: Secondary | ICD-10-CM

## 2023-04-14 DIAGNOSIS — Z8719 Personal history of other diseases of the digestive system: Secondary | ICD-10-CM | POA: Diagnosis not present

## 2023-04-14 DIAGNOSIS — R140 Abdominal distension (gaseous): Secondary | ICD-10-CM

## 2023-04-14 DIAGNOSIS — K2100 Gastro-esophageal reflux disease with esophagitis, without bleeding: Secondary | ICD-10-CM

## 2023-04-14 DIAGNOSIS — K6389 Other specified diseases of intestine: Secondary | ICD-10-CM | POA: Diagnosis not present

## 2023-04-14 LAB — TSH: TSH: 0.11 u[IU]/mL — ABNORMAL LOW (ref 0.35–5.50)

## 2023-04-14 LAB — C-REACTIVE PROTEIN: CRP: <1 mg/dL (ref 0.5–20.0)

## 2023-04-14 MED ORDER — FAMOTIDINE 20 MG PO TABS: 20.0000 mg | ORAL_TABLET | Freq: Two times a day (BID) | ORAL | 1 refills | Status: DC

## 2023-04-14 NOTE — Progress Notes (Unsigned)
Referring Provider: Jeanann Lewandowsky, MD Primary Care Physician:  Jeanann Lewandowsky, MD  Reason for Consultation:  Pre-stomach problems   IMPRESSION:  Dysphagia to solids x years History of esophagitis Hiatal hernia GERD Diffuse abdominal pain Post-prandial bowel movements and cramping    - evaluated and treated by [facility] History of colon polyp  PLAN: - Start famotidine 20 mg every day. If not effective, then will restart omeprazole  - Check CRP, TTG IgA, IgA, TSH - Check SIBO breath test - KUB  - RTC 3 months  HPI: Rebecca Barber is a 72 y.o. female self-referred for "pre-stomach problems."   She has a longstanding history of postprandial bowel movements and cramping.  She has been working with Dr. Lyla Son at [facility].  Controls her symptoms by avoiding dairy, bread, and sugars.  Since switching to a diet for type a blood her symptoms have been better controlled.  She was diagnosed with a tapeworm and treated with therapy for 4 months.  The patient did not feel it was asymptomatic tapeworm as she has gained weight as opposed to losing weight during this time. In fact, she is very bothered by unintentional weight despite her strict diet.  Notes a longstanding history of dysphagia with solids x years. Associated choking with lodging at the sternal notched. Identified broccoli has a frequent offender. Improved with sips of water. Feels like the muscle aren't contracting. No odynophagia, sore throat, neck pain, dysphonia. She has not discussed these symptoms with Dyanne Iha.  She would like an upper endoscopy for further evaluation.  Prior endoscopic evaluation includes: Colonosocpy with Dr. Juanda Chance  04/21/2002: normal Colonoscopy with Dr. Juanda Chance 07/19/14 revealed a tubular adenoma in the ascending colon.  Although she thinks she has had 3 colonoscopies, the last being in 2015.  Surveillance colonoscopy recommended in 2015.  Severe anxiety  after her colonoscopy in 2015. She feels the bowel prep went into her lungs and caused her problems. Treated with albuterol nebulizer and deep breaths. Her husband had to be in the recovery room to calm her.   No complications after her colonoscopy in 2003.   No known family history of colon cancer or polyps. No family history of uterine/endometrial cancer, pancreatic cancer or gastric/stomach cancer.  EGD 03/04/21: - LA Grade A ( one or more mucosal breaks less than 5 mm, not extending between tops of 2 mucosal folds) esophagitis with no bleeding was found. The z- line is located 36cm from the incisors. Biopsies were taken with a cold forceps for histology. Estimated blood loss was minimal.  - Diffuse minimal inflammation characterized by erythema, friability and granularity was found in the gastric body. Biopsies were taken from the antrum, body, and fundus with a cold forceps for histology. Estimated blood loss was minimal.  - A medium- sized hiatal hernia was present.  - The examined duodenum was normal. Biopsies were taken with a cold forceps for histology. Estimated blood loss was minimal.  - The exam was otherwise without abnormality. Path: 1. Surgical [P], duodenal - BENIGN DUODENAL MUCOSA WITH FOCAL INCREASED INTRAEPITHELIAL LYMPHOCYTES - NO ACUTE INFLAMMATION OR VILLOUS BLUNTING IDENTIFIED 1. These findings are non-specific. Possible etiologies include immune-mediated injuries, infections, medications and peptic disease. Clinical correlation with appropriate serologic studies should be considered; if clinically indicated. 2. Surgical [P], gastric antrum, body and fundus - MILD CHRONIC GASTRITIS WITHOUT ACTIVITY - NO H. PYLORI OR INTESTINAL METAPLASIA IDENTIFIED 2. Warthin-Starry stain is NEGATIVE for organisms morphologically consistent with Helicobacter pylori. 3.  Surgical [P], distal esophagus - REACTIVE SQUAMOUS MUCOSA WITH INCREASED INTRAEPITHELIAL LYMPHOCYTES AND RARE  EOSINOPHILS 3. The features are consistent with reflux disease in the appropriate clinical setting; correlation with endoscopy recommended.  Colonoscopy 03/04/21: - One 2 mm polyp in the ascending colon, removed with a cold snare. Resected and retrieved.  - The entire examined colon is normal. Biopsied.  - The examination was otherwise normal on direct and retroflexion views. Path: 4. Surgical [P], random colon, right and left colon - BENIGN COLONIC MUCOSA - NO ACTIVE INFLAMMATION OR EVIDENCE OF MICROSCOPIC COLITIS - NO HIGH GRADE DYSPLASIA OR MALIGNANCY IDENTIFIED 5. Surgical [P], colon, ascending, polyp (1) - TUBULAR ADENOMA (1 OF 2 FRAGMENTS) - BENIGN COLONIC MUCOSA (1 OF 2 FRAGMENTS) - NO HIGH-GRADE DYSPLASIA OR MALIGNANCY IDENTIFIED  Interval History: *** She has to follow a strict bone broth diet in order to prevent GI symptoms, except when she travels. She exercises extensively and still has difficulty losing weight. Endorses diarrhea. She has sludge that comes out in her BMs. The stools can be diarrhea, particularly if she eats normal foods. She will feels nauseous experience abdominal cramping after eating normal foods. She tries to limit how much omeprazole that she is taking.  Denies blood in the stools. Omeprazole helped with her dysphagia in her throat. Endorses chest burning and regurgitation occasionally. Endorses bloating. Endorses nocturnal stools.   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 she said thyroid made her sleep and have pain.   GERD (gastroesophageal reflux disease)    Hyperlipidemia    Insomnia    Postmenopausal    S/P catheter ablation of slow pathway EP stud w/RF catheter ablation of accessory pathway with [facility] 5/01   Thyroid disease  hypothyroidism    Past Surgical History:  Procedure Laterality Date   COLONOSCOPY     ganglion cyst  left foot ,age 38   heart ablation  2007   misfire and circulate    injury of left meniscus  Dr Charlann Boxer 02/2008   no surgery    ROTATOR CUFF REPAIR  RIGHT,Dr.Norris    Current Outpatient Medications  Medication Sig Dispense Refill   Ascorbic Acid (VITAMIN C) 100 MG tablet Take 100 mg by mouth daily.     BIOTIN PO Take 1 tablet by mouth daily.     Calcium Carb-Cholecalciferol (CALCIUM + D3 PO) Take 500 mg by mouth daily.     estradiol (ESTRACE) 1 MG tablet Take 1 mg by mouth daily.     Magnesium 500 MG TABS Take 500 mg by mouth daily.     Multiple Vitamins-Minerals (ZINC PO) Take 1 capsule by mouth daily.     NALTREXONE HCL PO Take by mouth.     NON FORMULARY Testosterone gel , daily 3 weeks on and 1 week off     NON FORMULARY [employer] [employer] Cann[employer]     ondansetron (ZOFRAN-ODT) 8 MG disintegrating tablet Take 8 mg by mouth every 8 (eight) hours as needed.     OVER THE COUNTER MEDICATION Vit D , one daily     PRESCRIPTION MEDICATION Nature thyroid, one daily 3/4 grain     progesterone (PROMETRIUM) 200 MG capsule Take 200 mg by mouth at bedtime.     omeprazole (PRILOSEC) 40 MG capsule  Take 1 capsule (40 mg total) by mouth every morning. (Patient not taking: Reported on 04/14/2023) 90 capsule 3   No current facility-administered medications for this visit.    Allergies as of 04/14/2023 - Review Complete 04/14/2023  Allergen Reaction Noted   Celebrex [celecoxib] Itching, Swelling, and Other (See Comments) 01/16/2011    Family History  Problem Relation Age of Onset   Stroke Mother    Transient ischemic attack Mother    Stroke Father        ?   Aneurysm Father        brain ?   Eating disorder Daughter    Fibromyalgia Daughter    Cancer Cousin        breast CA in her 78's   Heart attack Brother    Schizophrenia Brother    Cancer Brother        parathyroid and agent orange    Depression Brother    Colon cancer Neg Hx    Colon polyps Neg Hx    Esophageal cancer Neg Hx    Rectal cancer Neg Hx    Stomach cancer Neg Hx    Breast cancer Neg Hx     Social History   Socioeconomic History   Marital status: Married    Spouse name: Not on file   Number of children: 4   Years of education: Not on file   Highest education level: Not on file  Occupational History   Occupation: coaches [employer]  Tobacco Use   Smoking status: Never   Smokeless tobacco: Never  Vaping Use   Vaping status: Never Used  Substance and Sexual Activity   Alcohol use: Yes    Comment: Occ   Drug use: No   Sexual activity: Not on file  Other Topics Concern   Not on file  Social History Narrative   Not on file   Social Determinants of Health   Financial Resource Strain: Not on file  Food Insecurity: Not on file  Transportation Needs: Not on file  Physical Activity: Not on file  Stress: Not on file  Social Connections: Unknown (11/11/2022)   Received from [facility], [facility]   Social Network    Social Network: Not on file  Intimate Partner Violence: Unknown (11/11/2022)   Received from [facility], [facility]   HITS    Physically Hurt: Not on file    Insult or Talk Down To: Not on file    Threaten Physical Harm: Not on file    Scream or Curse: Not on file    Review of Systems: 12 system ROS is negative except as noted above.   Physical Exam: General:   Alert,  well-nourished, pleasant and cooperative in NAD Head:  Normocephalic and atraumatic. Eyes:  Sclera clear, no icterus.   Conjunctiva pink. Ears:  Normal auditory acuity. Nose:  No deformity, discharge,  or lesions. Mouth:  No deformity or lesions.   Neck:  Supple; no masses or thyromegaly. Lungs:  Clear throughout to auscultation.   No wheezes. Heart:  Regular rate and rhythm; no murmurs. Abdomen:  Soft,nontender, nondistended, normal bowel sounds, no rebound or guarding. No  hepatosplenomegaly.   Rectal:  Deferred  Msk:  Symmetrical. No boney deformities LAD: No inguinal or umbilical LAD Extremities:  No clubbing or edema. Neurologic:  Alert and  oriented x4;  grossly nonfocal Skin:  Intact without significant lesions or rashes. Psych:  Alert and cooperative. Normal mood and affect.   Eulah Pont,  MD 04/14/2023, 3:37 PM

## 2023-04-14 NOTE — Patient Instructions (Addendum)
Your provider has requested that you go to the basement level for lab work before leaving today. Press "B" on the elevator. The lab is located at the first door on the left as you exit the elevator.   Your provider has requested that you have an abdominal x ray before leaving today. Please go to the basement floor to our Radiology department for the test.   You have been given a testing kit to check for small intestine bacterial overgrowth (SIBO) which is completed by a company named [facility]. Make sure to return your test in the mail using the return mailing label given to you along with the kit. The test order, your demographic and insurance information have all already been sent to the company. [facility] will collect an upfront charge of $99.74 for commercial insurance plans and $209.74 is you are paying cash. Make sure to discuss with [facility] PRIOR to having the test to see if they have gotten information from your insurance company as to how much your testing will cost out of pocket, if any. Please contact [facility] at phone number [phone] to get instructions regarding how to perform the test as our office is unable to give specific testing instructions.   You are schedule for a follow up visit on 07/21/23 at 3 pm  We have sent the following medications to your pharmacy for you to pick up at your convenience: Famotidine  _______________________________________________________  If your blood pressure at your visit was 140/90 or greater, please contact your primary care physician to follow up on this.  _______________________________________________________  If you are age 73 or older, your body mass index should be between 23-30. Your Body mass index is 24.53 kg/m. If this is out of the aforementioned range listed, please consider follow up with your Primary Care Provider.  If you are age 73 or younger, your body mass index should be between 19-25. Your Body  mass index is 24.53 kg/m. If this is out of the aformentioned range listed, please consider follow up with your Primary Care Provider.   ________________________________________________________  The [facility] providers would like to encourage you to use [facility] to communicate with providers for non-urgent requests or questions.  Due to long hold times on the telephone, sending your provider a message by [facility] may be a faster and more efficient way to get a response.  Please allow 48 business hours for a response.  Please remember that this is for non-urgent requests.  _______________________________________________________   Due to recent changes in healthcare laws, you may see the results of your imaging and laboratory studies on MyChart before your provider has had a chance to review them.  We understand that in some cases there may be results that are confusing or concerning to you. Not all laboratory results come back in the same time frame and the provider may be waiting for multiple results in order to interpret others.  Please give [place] 48 hours in order for your provider to thoroughly review all the results before contacting the office for clarification of your results.    Thank you for entrusting me with your care and for choosing [facility], Dr. Eulah Pont

## 2023-04-15 LAB — IGA: Immunoglobulin A: 295 mg/dL (ref 70–320)

## 2023-04-15 LAB — TISSUE TRANSGLUTAMINASE, IGA: (tTG) Ab, IgA: <1 U/mL

## 2023-04-16 ENCOUNTER — Telehealth: Payer: Self-pay | Admitting: Internal Medicine

## 2023-04-16 NOTE — Progress Notes (Signed)
Hi Tisha, please let the patient know that her celiac disease lab and inflammatory marker lab were normal. Beth previously tried to call her about her thyroid lab but it looks like she did not pick up. If she picks up, please let her know that her thyroid levels may suggest that she is on too much thyroid medicine. We have faxed her thyroid level lab to her primary care physician so that the patient and her PCP can discuss this further.

## 2023-04-16 NOTE — Telephone Encounter (Signed)
Inbound call from [facility] needing clarification on a SIBO lab order. Call back number is [phone]. Please advise, thank you.

## 2023-04-17 ENCOUNTER — Telehealth: Payer: Self-pay

## 2023-04-17 ENCOUNTER — Telehealth: Payer: Self-pay | Admitting: Internal Medicine

## 2023-04-17 DIAGNOSIS — Z8719 Personal history of other diseases of the digestive system: Secondary | ICD-10-CM | POA: Diagnosis not present

## 2023-04-17 DIAGNOSIS — R1084 Generalized abdominal pain: Secondary | ICD-10-CM | POA: Diagnosis not present

## 2023-04-17 DIAGNOSIS — R1310 Dysphagia, unspecified: Secondary | ICD-10-CM | POA: Diagnosis not present

## 2023-04-17 DIAGNOSIS — K449 Diaphragmatic hernia without obstruction or gangrene: Secondary | ICD-10-CM | POA: Diagnosis not present

## 2023-04-17 NOTE — Telephone Encounter (Signed)
Patient said she is doing a SIBO test and it is needing some info before she returns it.  Asking to speak with a nurse

## 2023-04-17 NOTE — Telephone Encounter (Signed)
Called patient on mobile number to discuss lab results no answer left message on voicemail to call office back.

## 2023-04-17 NOTE — Telephone Encounter (Signed)
 Called patient. No answer. Left a message of my call.

## 2023-04-24 ENCOUNTER — Encounter: Payer: Self-pay | Admitting: Internal Medicine

## 2023-04-24 NOTE — Progress Notes (Signed)
Received results of the patient's SIBO breath test, which was collected on 04/17/23.   Increase in hydrogen was high at 67 ppm (nml <20 ppm). Increase in methane was normal at 9 ppm (nml <12 ppm). Increase in combined hydrogen and methane gas was high at 76 ppm (nml <15 ppm). Bacterial overgrowth is suggested.  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 I would recommend that she undergo a 14 day course of treatment with rifaximin 550 mg TID. If we cannot get this approved, then could consider Augmentin or Flagyl as a substitute.

## 2023-04-28 ENCOUNTER — Encounter: Payer: Self-pay | Admitting: *Deleted

## 2023-04-30 ENCOUNTER — Other Ambulatory Visit: Payer: Self-pay

## 2023-04-30 ENCOUNTER — Telehealth: Payer: Self-pay

## 2023-04-30 DIAGNOSIS — R1310 Dysphagia, unspecified: Secondary | ICD-10-CM

## 2023-04-30 DIAGNOSIS — L821 Other seborrheic keratosis: Secondary | ICD-10-CM | POA: Diagnosis not present

## 2023-04-30 DIAGNOSIS — Z8719 Personal history of other diseases of the digestive system: Secondary | ICD-10-CM

## 2023-04-30 DIAGNOSIS — L578 Other skin changes due to chronic exposure to nonionizing radiation: Secondary | ICD-10-CM | POA: Diagnosis not present

## 2023-04-30 DIAGNOSIS — L570 Actinic keratosis: Secondary | ICD-10-CM | POA: Diagnosis not present

## 2023-04-30 DIAGNOSIS — K449 Diaphragmatic hernia without obstruction or gangrene: Secondary | ICD-10-CM

## 2023-04-30 DIAGNOSIS — D2271 Melanocytic nevi of right lower limb, including hip: Secondary | ICD-10-CM | POA: Diagnosis not present

## 2023-04-30 DIAGNOSIS — K2100 Gastro-esophageal reflux disease with esophagitis, without bleeding: Secondary | ICD-10-CM

## 2023-04-30 DIAGNOSIS — R197 Diarrhea, unspecified: Secondary | ICD-10-CM

## 2023-04-30 DIAGNOSIS — Z85828 Personal history of other malignant neoplasm of skin: Secondary | ICD-10-CM | POA: Diagnosis not present

## 2023-04-30 DIAGNOSIS — L723 Sebaceous cyst: Secondary | ICD-10-CM | POA: Diagnosis not present

## 2023-04-30 DIAGNOSIS — R140 Abdominal distension (gaseous): Secondary | ICD-10-CM

## 2023-04-30 MED ORDER — RIFAXIMIN 550 MG PO TABS: 550.0000 mg | ORAL_TABLET | Freq: Three times a day (TID) | ORAL | 0 refills | Status: DC

## 2023-04-30 MED ORDER — RIFAXIMIN 550 MG PO TABS: 550.0000 mg | ORAL_TABLET | Freq: Three times a day (TID) | ORAL | Status: DC

## 2023-04-30 NOTE — Telephone Encounter (Signed)
Please start a PA for Xifaxan.

## 2023-05-05 ENCOUNTER — Other Ambulatory Visit: Payer: Self-pay

## 2023-05-05 ENCOUNTER — Other Ambulatory Visit (HOSPITAL_COMMUNITY): Payer: Self-pay

## 2023-05-05 MED ORDER — RIFAXIMIN 550 MG PO TABS: 550.0000 mg | ORAL_TABLET | Freq: Three times a day (TID) | ORAL | 0 refills | Status: AC

## 2023-05-06 NOTE — Telephone Encounter (Signed)
Dr Leonides Schanz The Xifaxan will likely not be covered with the diagnosis of SIBO.  Can we change to an alternative treatment?

## 2023-05-07 ENCOUNTER — Telehealth: Payer: Self-pay

## 2023-05-07 ENCOUNTER — Other Ambulatory Visit (HOSPITAL_COMMUNITY): Payer: Self-pay

## 2023-05-07 NOTE — Telephone Encounter (Signed)
Pharmacy Patient Advocate Encounter   Received notification from CoverMyMeds that prior authorization for Xifaxan 550MG  tablets is required/requested.   Insurance verification completed.   The patient is insured through  [facility]  .   Per test claim: PA required; PA submitted to [facility] MEDICARE via CoverMyMeds Key/confirmation #/EOC [id number] Status is pending

## 2023-05-07 NOTE — Telephone Encounter (Signed)
Molli Knock will do.  PA request has been Submitted. New Encounter created for follow up. For additional info see Pharmacy Prior Auth telephone encounter from 05/07/2023.

## 2023-05-11 ENCOUNTER — Other Ambulatory Visit (HOSPITAL_COMMUNITY): Payer: Self-pay

## 2023-05-11 NOTE — Telephone Encounter (Signed)
Patient notified

## 2023-05-11 NOTE — Telephone Encounter (Signed)
Pharmacy Patient Advocate Encounter  Received notification from [facility] that Prior Authorization for Xifaxan 550MG  tablets has been APPROVED from 05-07-2023 to 05-21-2023   PA #/Case ID/Reference #: [id number]

## 2023-06-06 ENCOUNTER — Other Ambulatory Visit: Payer: Self-pay | Admitting: Internal Medicine

## 2023-07-21 ENCOUNTER — Encounter: Payer: Self-pay | Admitting: Internal Medicine

## 2023-07-21 ENCOUNTER — Other Ambulatory Visit (INDEPENDENT_AMBULATORY_CARE_PROVIDER_SITE_OTHER): Payer: Medicare Other

## 2023-07-21 ENCOUNTER — Ambulatory Visit: Payer: Medicare Other | Admitting: Internal Medicine

## 2023-07-21 VITALS — BP 118/70 | HR 93 | Ht 66.0 in | Wt 162.0 lb

## 2023-07-21 DIAGNOSIS — R1310 Dysphagia, unspecified: Secondary | ICD-10-CM | POA: Diagnosis not present

## 2023-07-21 DIAGNOSIS — K5900 Constipation, unspecified: Secondary | ICD-10-CM

## 2023-07-21 DIAGNOSIS — R197 Diarrhea, unspecified: Secondary | ICD-10-CM

## 2023-07-21 DIAGNOSIS — Z860101 Personal history of adenomatous and serrated colon polyps: Secondary | ICD-10-CM

## 2023-07-21 DIAGNOSIS — R1084 Generalized abdominal pain: Secondary | ICD-10-CM

## 2023-07-21 DIAGNOSIS — E039 Hypothyroidism, unspecified: Secondary | ICD-10-CM | POA: Diagnosis not present

## 2023-07-21 DIAGNOSIS — K2100 Gastro-esophageal reflux disease with esophagitis, without bleeding: Secondary | ICD-10-CM

## 2023-07-21 DIAGNOSIS — K649 Unspecified hemorrhoids: Secondary | ICD-10-CM | POA: Diagnosis not present

## 2023-07-21 LAB — COMPREHENSIVE METABOLIC PANEL
ALT: 14 U/L (ref 0–35)
AST: 18 U/L (ref 0–37)
Albumin: 4.3 g/dL (ref 3.5–5.2)
Alkaline Phosphatase: 81 U/L (ref 39–117)
BUN: 29 mg/dL — ABNORMAL HIGH (ref 6–23)
CO2: 30 meq/L (ref 19–32)
Calcium: 9.3 mg/dL (ref 8.4–10.5)
Chloride: 106 meq/L (ref 96–112)
Creatinine, Ser: 1.1 mg/dL (ref 0.40–1.20)
GFR: 49.76 mL/min — ABNORMAL LOW (ref >60.00)
Glucose, Bld: 90 mg/dL (ref 70–99)
Potassium: 3.7 meq/L (ref 3.5–5.1)
Sodium: 141 meq/L (ref 135–145)
Total Bilirubin: 0.4 mg/dL (ref 0.2–1.2)
Total Protein: 7.5 g/dL (ref 6.0–8.3)

## 2023-07-21 LAB — CBC WITH DIFFERENTIAL/PLATELET
Basophils Absolute: 0 10*3/uL (ref 0.0–0.1)
Basophils Relative: 0.4 % (ref 0.0–3.0)
Eosinophils Absolute: 0 10*3/uL (ref 0.0–0.7)
Eosinophils Relative: 0.1 % (ref 0.0–5.0)
HCT: 45 % (ref 36.0–46.0)
Hemoglobin: 14.9 g/dL (ref 12.0–15.0)
Lymphocytes Relative: 27.4 % (ref 12.0–46.0)
Lymphs Abs: 1.8 10*3/uL (ref 0.7–4.0)
MCHC: 33.1 g/dL (ref 30.0–36.0)
MCV: 94.2 fL (ref 78.0–100.0)
Monocytes Absolute: 0.7 10*3/uL (ref 0.1–1.0)
Monocytes Relative: 10.1 % (ref 3.0–12.0)
Neutro Abs: 4.1 10*3/uL (ref 1.4–7.7)
Neutrophils Relative %: 62 % (ref 43.0–77.0)
Platelets: 228 10*3/uL (ref 150.0–400.0)
RBC: 4.78 Mil/uL (ref 3.87–5.11)
RDW: 12.9 % (ref 11.5–15.5)
WBC: 6.6 10*3/uL (ref 4.0–10.5)

## 2023-07-21 LAB — TSH: TSH: 0.85 u[IU]/mL (ref 0.35–5.50)

## 2023-07-21 MED ORDER — LINACLOTIDE 145 MCG PO CAPS: 145.0000 ug | ORAL_CAPSULE | Freq: Every day | ORAL | 0 refills | Status: AC

## 2023-07-21 MED ORDER — HYDROCORTISONE (PERIANAL) 2.5 % EX CREA: 1.0000 | TOPICAL_CREAM | Freq: Two times a day (BID) | CUTANEOUS | 0 refills | Status: AC

## 2023-07-21 NOTE — Progress Notes (Signed)
IMPRESSION:  Dysphagia to solids x years History of esophagitis Hiatal hernia GERD History of SIBO Diffuse abdominal pain Diarrhea Bloating History of colon polyp Hemorrhoids Patient presents for follow-up of suspected overflow diarrhea.  Her last KUB showed stool burden that suggest underlying constipation.  Patient did try MiraLAX once a day for a week, but this did not seem to help with her symptoms.  I recommended that we try to be more aggressive about treating her constipation to see if this helps with her symptoms.  I recommended that the patient try a low FODMAP diet, daily fiber supplement, and Linzess daily.  For her hemorrhoids, I recommended that she use Anusol HC cream.  I also recommend that she try pelvic floor physical therapy to see if this helps with her bowel habits.  Will also check her thyroid levels to see if this has normalized.  Her dysphagia and GERD seem to be well-controlled on famotidine therapy.  PLAN: - Low FODMAP diet - Check CBC, CMP, TSH - Drink 8 cups of water per day, walk 30 min per day, daily fiber supplement - Continue famotidine 20 mg BID - Start Linzess 145 mcg QD - Anusol HC cream BID for 7 days - Referral for pelvic floor PT - Next colonoscopy could potentially be due in 02/2028 for polyp surveillance - RTC 3 months  HPI: Rebecca Barber is a 72 y.o. female presents for follow up of diarrhea and dysphagia  Prior endoscopic evaluation includes: Colonosocpy with Dr. Juanda Chance  04/21/2002: normal Colonoscopy with Dr. Juanda Chance 07/19/14 revealed a tubular adenoma in the ascending colon.   Labs 03/2023: TSH low.  TTG IgA negative. IGA nml. CRP nml.    KUB 04/14/23: IMPRESSION: Stool within the cecum and ascending colon. Nonobstructed bowel-gas pattern.  SIBO breath test 04/17/23:  Increase in hydrogen was high at 67 ppm (nml <20 ppm). Increase in methane was normal at 9 ppm (nml <12 ppm). Increase in combined hydrogen and methane gas was high at 76  ppm (nml <15 ppm). Bacterial overgrowth is suggested.  EGD 03/04/21: - LA Grade A ( one or more mucosal breaks less than 5 mm, not extending between tops of 2 mucosal folds) esophagitis with no bleeding was found. The z- line is located 36cm from the incisors. Biopsies were taken with a cold forceps for histology. Estimated blood loss was minimal.  - Diffuse minimal inflammation characterized by erythema, friability and granularity was found in the gastric body. Biopsies were taken from the antrum, body, and fundus with a cold forceps for histology. Estimated blood loss was minimal.  - A medium- sized hiatal hernia was present.  - The examined duodenum was normal. Biopsies were taken with a cold forceps for histology. Estimated blood loss was minimal.  - The exam was otherwise without abnormality. Path: 1. Surgical [P], duodenal - BENIGN DUODENAL MUCOSA WITH FOCAL INCREASED INTRAEPITHELIAL LYMPHOCYTES - NO ACUTE INFLAMMATION OR VILLOUS BLUNTING IDENTIFIED 1. These findings are non-specific. Possible etiologies include immune-mediated injuries, infections, medications and peptic disease. Clinical correlation with appropriate serologic studies should be considered; if clinically indicated. 2. Surgical [P], gastric antrum, body and fundus - MILD CHRONIC GASTRITIS WITHOUT ACTIVITY - NO H. PYLORI OR INTESTINAL METAPLASIA IDENTIFIED 2. Warthin-Starry stain is NEGATIVE for organisms morphologically consistent with Helicobacter pylori. 3. Surgical [P], distal esophagus - REACTIVE SQUAMOUS MUCOSA WITH INCREASED INTRAEPITHELIAL LYMPHOCYTES AND RARE EOSINOPHILS 3. The features are consistent with reflux disease in the appropriate clinical setting; correlation with endoscopy recommended.  Colonoscopy 03/04/21: - One 2 mm polyp in the ascending colon, removed with a cold snare. Resected and retrieved.  - The entire examined colon is normal. Biopsied.  - The examination was otherwise normal on direct and  retroflexion views. Path: 4. Surgical [P], random colon, right and left colon - BENIGN COLONIC MUCOSA - NO ACTIVE INFLAMMATION OR EVIDENCE OF MICROSCOPIC COLITIS - NO HIGH GRADE DYSPLASIA OR MALIGNANCY IDENTIFIED 5. Surgical [P], colon, ascending, polyp (1) - TUBULAR ADENOMA (1 OF 2 FRAGMENTS) - BENIGN COLONIC MUCOSA (1 OF 2 FRAGMENTS) - NO HIGH-GRADE DYSPLASIA OR MALIGNANCY IDENTIFIED  Interval History: SIBO antibiotic therapy did not help with her symptoms. Endorses increased stool frequency. Stools can be fairly frequent every 30 min and loose. Endorses rectal pain, bleeding, and itching, which she primarily attributes to wiping. She was told by an integrative health specialist that she had tapeworm and then parasites infections. She was treated for tapeworm/parasite infections for months, but this did not seem to help with her symptoms. Miralax did not help with her stool habits. When she takes her famotidine BID, she doesn't have any dysphagia. Pain is slightly better than it has been in the past but she does still have some ab pain. Patient plays tennis at an advanced level and uses [employer].  Patient does have fairly significant stressors in her life right now.  Her husband is about to get a Watchman procedure, and she does not have adequate heating in their house currently.  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 she said thyroid made her sleep and have pain.   GERD (gastroesophageal reflux disease)    Hyperlipidemia    Insomnia    Postmenopausal    S/P catheter ablation of slow pathway EP stud w/RF catheter ablation of accessory pathway with [facility] 5/01    Thyroid disease hypothyroidism    Past Surgical History:  Procedure Laterality Date   COLONOSCOPY     ganglion cyst  left foot ,age 89   heart ablation  2007   misfire and circulate    injury of left meniscus  Dr Charlann Boxer 02/2008   no surgery    ROTATOR CUFF REPAIR  RIGHT,Dr.Norris    Current Outpatient Medications  Medication Sig Dispense Refill   Ascorbic Acid (VITAMIN C) 100 MG tablet Take 100 mg by mouth daily.     BIOTIN PO Take 1 tablet by mouth daily.     Calcium Carb-Cholecalciferol (CALCIUM + D3 PO) Take 500 mg by mouth daily.     estradiol (ESTRACE) 1 MG tablet Take 1 mg by mouth daily.     famotidine (PEPCID) 20 MG tablet TAKE 1 TABLET BY MOUTH TWICE A DAY 60 tablet 2   Magnesium 500 MG TABS Take 500 mg by mouth daily.     Multiple Vitamins-Minerals (ZINC PO) Take 1 capsule by mouth daily.     NON FORMULARY Testosterone gel , daily 3 weeks on and 1 week off     NON FORMULARY [employer]     OVER THE COUNTER MEDICATION Vit D , one daily     PRESCRIPTION MEDICATION Nature thyroid, one daily  3/4 grain     progesterone (PROMETRIUM) 200 MG capsule Take 200 mg by mouth at bedtime.     NALTREXONE HCL PO Take by mouth.     ondansetron (ZOFRAN-ODT) 8 MG disintegrating tablet Take 8 mg by mouth every 8 (eight) hours as needed.     No current facility-administered medications for this visit.    Allergies as of 07/21/2023 - Review Complete 07/21/2023  Allergen Reaction Noted   Celebrex [celecoxib] Itching, Swelling, and Other (See Comments) 01/16/2011    Family History  Problem Relation Age of Onset   Stroke Mother    Transient ischemic attack Mother    Stroke Father        ?   Aneurysm Father        brain ?   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  Tobacco Use   Smoking status: Never   Smokeless tobacco: Never  Vaping Use   Vaping status: Never Used  Substance and Sexual Activity   Alcohol use: Yes    Comment: Occ   Drug use: No   Sexual activity: Not on file  Other Topics Concern   Not on file  Social History Narrative   Not on file   Social Determinants of Health   Financial Resource Strain: Not on file  Food Insecurity: Not on file  Transportation Needs: Not on file  Physical Activity: Not on file  Stress: Not on file  Social Connections: Unknown (11/11/2022)   Received from [facility]   Social Network    Social Network: Not on file  Intimate Partner Violence: Unknown (11/11/2022)   Received from [facility]   HITS    Physically Hurt: Not on file    Insult or Talk Down To: Not on file    Threaten Physical Harm: Not on file    Scream or Curse: Not on file   Physical Exam: General:   Alert,  well-nourished, pleasant and cooperative in NAD Head:  Normocephalic and atraumatic. Lungs:  Clear throughout to auscultation.   No wheezes. Heart:  Regular rate and rhythm; no murmurs. Abdomen:  Soft,nontender, nondistended, normal bowel sounds, no rebound or guarding.  Extremities:  No clubbing or edema. Neurologic:  Alert and  oriented x4 Skin:  Intact  Psych:  Alert and cooperative. Normal mood and affect.   Eulah Pont, MD 07/21/2023, 3:01 PM  I spent 44 minutes of time, including in depth chart review, independent review of results as outlined above, communicating results with the patient directly,  face-to-face time with the patient, coordinating care, ordering studies and medications as appropriate, and documentation.

## 2023-07-21 NOTE — Patient Instructions (Signed)
Your provider has requested that you go to the basement level for lab work before leaving today. Press "B" on the elevator. The lab is located at the first door on the left as you exit the elevator.  We have sent the following medications to your pharmacy for you to pick up at your convenience: Anusol, Linzess  A referral was placed for Pelvic Floor therapy they will contact you to schedule a appointment   Drink 8 cups of water a day and walk 30 minutes a day.  Please purchase the following medications over the counter and take as directed: Fiber supplement such as Benefiber- use as directed daily   Follow up in 3 months  If your blood pressure at your visit was 140/90 or greater, please contact your primary care physician to follow up on this.  _______________________________________________________  If you are age 82 or older, your body mass index should be between 23-30. Your Body mass index is 26.15 kg/m. If this is out of the aforementioned range listed, please consider follow up with your Primary Care Provider.  If you are age 55 or younger, your body mass index should be between 19-25. Your Body mass index is 26.15 kg/m. If this is out of the aformentioned range listed, please consider follow up with your Primary Care Provider.   ________________________________________________________  The [facility] providers would like to encourage you to use [facility] to communicate with providers for non-urgent requests or questions.  Due to long hold times on the telephone, sending your provider a message by [facility] may be a faster and more efficient way to get a response.  Please allow 48 business hours for a response.  Please remember that this is for non-urgent requests.  _______________________________________________________  Due to recent changes in healthcare laws, you may see the results of your imaging and laboratory studies on MyChart before your provider has had a chance to review  them.  We understand that in some cases there may be results that are confusing or concerning to you. Not all laboratory results come back in the same time frame and the provider may be waiting for multiple results in order to interpret others.  Please give [place] 48 hours in order for your provider to thoroughly review all the results before contacting the office for clarification of your results.   Thank you for entrusting me with your care and for choosing [facility], Dr. Eulah Pont

## 2023-08-26 ENCOUNTER — Other Ambulatory Visit: Payer: Self-pay

## 2023-08-26 ENCOUNTER — Encounter (HOSPITAL_BASED_OUTPATIENT_CLINIC_OR_DEPARTMENT_OTHER): Payer: Self-pay

## 2023-08-26 ENCOUNTER — Emergency Department (HOSPITAL_BASED_OUTPATIENT_CLINIC_OR_DEPARTMENT_OTHER)
Admission: EM | Admit: 2023-08-26 | Discharge: 2023-08-26 | Disposition: A | Discharge status: Home / Self Care | Payer: Medicare Other | Attending: Emergency Medicine | Admitting: Emergency Medicine

## 2023-08-26 DIAGNOSIS — H5711 Ocular pain, right eye: Secondary | ICD-10-CM | POA: Diagnosis present

## 2023-08-26 MED ORDER — TOBRAMYCIN-DEXAMETHASONE 0.3-0.1 % OP SUSP: 2.0000 [drp] | Freq: Four times a day (QID) | OPHTHALMIC | 0 refills | Status: AC

## 2023-08-26 MED ORDER — FLUORESCEIN SODIUM 1 MG OP STRP
1.0000 | ORAL_STRIP | Freq: Once | OPHTHALMIC | Status: AC
  Administered 2023-08-26: 1 via OPHTHALMIC
  Filled 2023-08-26: qty 1

## 2023-08-26 MED ORDER — TETRACAINE HCL 0.5 % OP SOLN
2.0000 [drp] | Freq: Once | OPHTHALMIC | Status: AC
Start: 2023-08-26 — End: 2023-08-26
  Administered 2023-08-26: 2 [drp] via OPHTHALMIC
  Filled 2023-08-26: qty 4

## 2023-08-26 NOTE — ED Notes (Signed)
 ED Provider at bedside.

## 2023-08-26 NOTE — ED Triage Notes (Signed)
 Pt states she began having rt. Eye pain yesterday and has increased this am.   Denies any injury Eye is red, states it feels irritated

## 2023-08-26 NOTE — ED Provider Notes (Signed)
 [place] EMERGENCY DEPARTMENT AT [facility] Provider Note   CSN: [id number] Arrival date & time: 08/26/23  0545     History  Chief Complaint  Patient presents with   Eye Pain    Rebecca Barber is a 73 y.o. female.  Patient is a 73 year old female who presents with right eye irritation and pain.  She said it started a little bit yesterday but got worse today.  She denies any vision changes.  She has had some watery discharge.  No crusting.  She does not know if she got anything in the eye but does not think that she did.  She does not wear contacts.  She denies any rash around the eye.       Home Medications Prior to Admission medications   Medication Sig Start Date End Date Taking? Authorizing Provider  thyroid  (ARMOUR) 15 MG tablet Take 15 mg by mouth every morning. 06/06/23  Yes [provider]  tobramycin -dexamethasone  (TOBRADEX ) ophthalmic solution Place 2 drops into the right eye every 6 (six) hours for 7 days. 08/26/23 09/02/23 Yes Lenor Hollering, MD  Ascorbic Acid (VITAMIN C) 100 MG tablet Take 100 mg by mouth daily.    [provider]  BIOTIN PO Take 1 tablet by mouth daily.    [provider]  Calcium Carb-Cholecalciferol (CALCIUM + D3 PO) Take 500 mg by mouth daily.    [provider]  estradiol (ESTRACE) 1 MG tablet Take 1 mg by mouth daily. 06/14/20   [provider]  famotidine  (PEPCID ) 20 MG tablet TAKE 1 TABLET BY MOUTH TWICE A DAY 06/06/23   Dorsey, Ying C, MD  levothyroxine (SYNTHROID ) 25 MCG tablet Take 25 mcg by mouth daily.    [provider]  linaclotide  (LINZESS ) 145 MCG CAPS capsule Take 1 capsule (145 mcg total) by mouth daily before breakfast. 07/21/23 07/15/24  Dorsey, Ying C, MD  Magnesium 500 MG TABS Take 500 mg by mouth daily.    [provider]  Multiple Vitamins-Minerals (ZINC PO) Take 1 capsule by mouth daily.    [provider]  NON FORMULARY Testosterone gel , daily 3  weeks on and 1 week off    [provider]  NON FORMULARY [employer] Cann[employer]    [provider]  OVER THE COUNTER MEDICATION Vit D , one daily    [provider]  PRESCRIPTION MEDICATION Nature thyroid , one daily 3/4 grain    [provider]  progesterone (PROMETRIUM) 200 MG capsule Take 200 mg by mouth at bedtime. 08/02/20   [provider]      Allergies    Celebrex [celecoxib]    Review of Systems   Review of Systems  Constitutional:  Negative for fever.  HENT:  Negative for congestion, facial swelling and rhinorrhea.   Eyes:  Positive for photophobia, pain, discharge and redness. Negative for visual disturbance.  Gastrointestinal:  Negative for nausea and vomiting.  Skin:  Negative for rash.  Neurological:  Negative for headaches.    Physical Exam Updated Vital Signs BP (!) 161/85 (BP Location: Right Arm)   Pulse 89   Temp 98.3 F (36.8 C)   Resp 18   Ht 5' 6 (1.676 m)   Wt 65.8 kg   SpO2 98%   BMI 23.40 kg/m  Physical Exam Constitutional:      Appearance: She is well-developed.  HENT:     Head: Normocephalic and atraumatic.  Eyes:     Extraocular Movements: Extraocular movements  intact.     Pupils: Pupils are equal, round, and reactive to light.     Comments: Right eye conjunctive is injected.  There is watery discharge.  No lesions are noted.  No foreign bodies are noted.  Pressure is 14 in the right eye.  There is no staining areas.  No rash around the eye.  Cardiovascular:     Rate and Rhythm: Normal rate.  Pulmonary:     Effort: Pulmonary effort is normal.  Musculoskeletal:     Cervical back: Normal range of motion and neck supple.  Skin:    General: Skin is warm and dry.  Neurological:     Mental Status: She is alert and oriented to person, place, and time.     ED Results / Procedures / Treatments   Labs (all labs ordered are listed, but only abnormal results are displayed) Labs Reviewed - No data to  display  EKG None  Radiology No results found.  Procedures Procedures    Medications Ordered in ED Medications  fluorescein  ophthalmic strip 1 strip (1 strip Right Eye Given 08/26/23 0924)  tetracaine  (PONTOCAINE) 0.5 % ophthalmic solution 2 drop (2 drops Right Eye Given 08/26/23 9071)    ED Course/ Medical Decision Making/ A&P                                 Medical Decision Making Risk Prescription drug management.   Patient is a 73 year old who presents with right eye pain and redness.  It is injected.  There is watery discharge.  She has some photophobia.  I do not appreciate any herpetic lesions.  No dendrites are visualized.  No corneal abrasions are noted.  She has normal pressure in the eye.  Her visual acuity was 20/40 in that eye compared to 20/20 in the other eye.  She does not wear contacts.  I discussed the findings with Dr. Austin with [facility] eye.  Dr. Austin is currently in [place] today and can see her in the [place] office but the patient does not want to travel to [place] to be seen today.  Dr. Austin recommends starting her on TobraDex .  Will start this and have her follow-up with Dr. Austin in the office.  The patient will call make an appointment.  Return precautions were given.  Final Clinical Impression(s) / ED Diagnoses Final diagnoses:  Eye pain, right    Rx / DC Orders ED Discharge Orders          Ordered    tobramycin -dexamethasone  (TOBRADEX ) ophthalmic solution  Every 6 hours        08/26/23 1051              Lenor Hollering, MD 08/26/23 1055

## 2023-09-14 ENCOUNTER — Encounter: Payer: Self-pay | Admitting: Internal Medicine

## 2023-10-20 ENCOUNTER — Telehealth: Payer: Self-pay | Admitting: Internal Medicine

## 2023-10-20 NOTE — Telephone Encounter (Signed)
 Patient called and stated that she would like to schedule an appointment with Dr. Leonides Schanz. I advise the patient that Dr. Leonides Schanz does not have any availability and she would need to schedule with a PA. Patient stated that she does not want to see the PA she would only like to schedule with Dr. Leonides Schanz. Patient is requesting a call back. Please advise.

## 2023-10-21 NOTE — Telephone Encounter (Signed)
 Called the patient. No answer. Left a message of my call on her cell pone. No answer, no voicemail on the home phone.

## 2023-10-27 NOTE — Telephone Encounter (Signed)
 Called home phone. No answer. No voicemail. Called cell phone. Left voicemail of my call.

## 2023-11-09 ENCOUNTER — Other Ambulatory Visit: Payer: Self-pay

## 2023-11-09 MED ORDER — FAMOTIDINE 20 MG PO TABS: 20.0000 mg | ORAL_TABLET | Freq: Two times a day (BID) | ORAL | 2 refills | Status: AC

## 2023-11-09 NOTE — Telephone Encounter (Signed)
 Inbound call from requesting medication refill for famotodine.   [facility] [phone]  Please advise.
